# Patient Record
Sex: Male | Born: 1975 | Race: Black or African American | Hispanic: No | Smoking: Current every day smoker
Health system: Southern US, Community
[De-identification: ages and names within clinical notes are randomized; demographics above are authoritative.]

## PROBLEM LIST (undated history)

## (undated) DIAGNOSIS — M797 Fibromyalgia: Secondary | ICD-10-CM

## (undated) DIAGNOSIS — I10 Essential (primary) hypertension: Secondary | ICD-10-CM

## (undated) DIAGNOSIS — M329 Systemic lupus erythematosus, unspecified: Secondary | ICD-10-CM

## (undated) DIAGNOSIS — IMO0002 Reserved for concepts with insufficient information to code with codable children: Secondary | ICD-10-CM

## (undated) DIAGNOSIS — M1990 Unspecified osteoarthritis, unspecified site: Secondary | ICD-10-CM

## (undated) DIAGNOSIS — M5430 Sciatica, unspecified side: Secondary | ICD-10-CM

## (undated) HISTORY — PX: CHOLECYSTECTOMY: SHX55

## (undated) HISTORY — PX: HERNIA REPAIR: SHX51

---

## 2000-10-13 ENCOUNTER — Emergency Department (HOSPITAL_COMMUNITY): Admission: EM | Admit: 2000-10-13 | Discharge: 2000-10-13 | Payer: Self-pay | Admitting: *Deleted

## 2001-02-28 ENCOUNTER — Encounter: Payer: Self-pay | Admitting: *Deleted

## 2001-02-28 ENCOUNTER — Emergency Department (HOSPITAL_COMMUNITY): Admission: EM | Admit: 2001-02-28 | Discharge: 2001-02-28 | Payer: Self-pay | Admitting: *Deleted

## 2001-06-01 ENCOUNTER — Emergency Department (HOSPITAL_COMMUNITY): Admission: EM | Admit: 2001-06-01 | Discharge: 2001-06-02 | Payer: Self-pay | Admitting: Emergency Medicine

## 2001-06-02 ENCOUNTER — Encounter: Payer: Self-pay | Admitting: Emergency Medicine

## 2001-08-25 ENCOUNTER — Emergency Department (HOSPITAL_COMMUNITY): Admission: EM | Admit: 2001-08-25 | Discharge: 2001-08-25 | Payer: Self-pay | Admitting: Emergency Medicine

## 2002-02-11 ENCOUNTER — Encounter: Payer: Self-pay | Admitting: Emergency Medicine

## 2002-02-11 ENCOUNTER — Emergency Department (HOSPITAL_COMMUNITY): Admission: EM | Admit: 2002-02-11 | Discharge: 2002-02-11 | Payer: Self-pay | Admitting: Internal Medicine

## 2002-04-17 ENCOUNTER — Emergency Department (HOSPITAL_COMMUNITY): Admission: EM | Admit: 2002-04-17 | Discharge: 2002-04-17 | Payer: Self-pay | Admitting: Emergency Medicine

## 2002-10-16 ENCOUNTER — Emergency Department (HOSPITAL_COMMUNITY): Admission: EM | Admit: 2002-10-16 | Discharge: 2002-10-17 | Payer: Self-pay | Admitting: Emergency Medicine

## 2002-10-17 ENCOUNTER — Encounter: Payer: Self-pay | Admitting: Emergency Medicine

## 2003-02-10 ENCOUNTER — Encounter: Payer: Self-pay | Admitting: *Deleted

## 2003-02-10 ENCOUNTER — Emergency Department (HOSPITAL_COMMUNITY): Admission: EM | Admit: 2003-02-10 | Discharge: 2003-02-11 | Payer: Self-pay | Admitting: Emergency Medicine

## 2003-04-25 ENCOUNTER — Emergency Department (HOSPITAL_COMMUNITY): Admission: EM | Admit: 2003-04-25 | Discharge: 2003-04-25 | Payer: Self-pay | Admitting: Emergency Medicine

## 2003-12-31 ENCOUNTER — Emergency Department (HOSPITAL_COMMUNITY): Admission: EM | Admit: 2003-12-31 | Discharge: 2003-12-31 | Payer: Self-pay | Admitting: *Deleted

## 2004-01-06 ENCOUNTER — Emergency Department (HOSPITAL_COMMUNITY): Admission: EM | Admit: 2004-01-06 | Discharge: 2004-01-07 | Payer: Self-pay | Admitting: Emergency Medicine

## 2004-02-02 ENCOUNTER — Emergency Department (HOSPITAL_COMMUNITY): Admission: EM | Admit: 2004-02-02 | Discharge: 2004-02-02 | Payer: Self-pay | Admitting: Emergency Medicine

## 2004-03-27 ENCOUNTER — Emergency Department (HOSPITAL_COMMUNITY): Admission: EM | Admit: 2004-03-27 | Discharge: 2004-03-28 | Payer: Self-pay | Admitting: *Deleted

## 2004-04-02 ENCOUNTER — Emergency Department (HOSPITAL_COMMUNITY): Admission: EM | Admit: 2004-04-02 | Discharge: 2004-04-02 | Payer: Self-pay | Admitting: Emergency Medicine

## 2004-12-01 ENCOUNTER — Emergency Department (HOSPITAL_COMMUNITY): Admission: EM | Admit: 2004-12-01 | Discharge: 2004-12-02 | Payer: Self-pay | Admitting: Emergency Medicine

## 2005-01-21 ENCOUNTER — Inpatient Hospital Stay (HOSPITAL_COMMUNITY): Admission: EM | Admit: 2005-01-21 | Discharge: 2005-01-26 | Payer: Self-pay | Admitting: Emergency Medicine

## 2005-01-23 ENCOUNTER — Encounter (INDEPENDENT_AMBULATORY_CARE_PROVIDER_SITE_OTHER): Payer: Self-pay | Admitting: General Surgery

## 2005-01-24 ENCOUNTER — Ambulatory Visit: Payer: Self-pay | Admitting: *Deleted

## 2005-02-12 ENCOUNTER — Observation Stay (HOSPITAL_COMMUNITY): Admission: EM | Admit: 2005-02-12 | Discharge: 2005-02-13 | Payer: Self-pay | Admitting: Emergency Medicine

## 2005-03-21 ENCOUNTER — Inpatient Hospital Stay (HOSPITAL_COMMUNITY): Admission: EM | Admit: 2005-03-21 | Discharge: 2005-03-25 | Payer: Self-pay | Admitting: Emergency Medicine

## 2005-03-24 ENCOUNTER — Encounter: Payer: Self-pay | Admitting: Internal Medicine

## 2005-04-05 ENCOUNTER — Emergency Department (HOSPITAL_COMMUNITY): Admission: EM | Admit: 2005-04-05 | Discharge: 2005-04-05 | Payer: Self-pay | Admitting: Emergency Medicine

## 2005-07-07 ENCOUNTER — Emergency Department (HOSPITAL_COMMUNITY): Admission: EM | Admit: 2005-07-07 | Discharge: 2005-07-07 | Payer: Self-pay | Admitting: Emergency Medicine

## 2006-08-27 ENCOUNTER — Emergency Department (HOSPITAL_COMMUNITY): Admission: EM | Admit: 2006-08-27 | Discharge: 2006-08-27 | Payer: Self-pay | Admitting: Emergency Medicine

## 2007-06-16 ENCOUNTER — Emergency Department (HOSPITAL_COMMUNITY): Admission: EM | Admit: 2007-06-16 | Discharge: 2007-06-16 | Payer: Self-pay | Admitting: Emergency Medicine

## 2008-01-06 ENCOUNTER — Emergency Department (HOSPITAL_COMMUNITY): Admission: EM | Admit: 2008-01-06 | Discharge: 2008-01-06 | Payer: Self-pay | Admitting: Emergency Medicine

## 2008-02-12 ENCOUNTER — Ambulatory Visit (HOSPITAL_COMMUNITY): Admission: RE | Admit: 2008-02-12 | Discharge: 2008-02-12 | Payer: Self-pay | Admitting: Pediatrics

## 2008-02-27 ENCOUNTER — Emergency Department (HOSPITAL_COMMUNITY): Admission: EM | Admit: 2008-02-27 | Discharge: 2008-02-27 | Payer: Self-pay | Admitting: Emergency Medicine

## 2008-03-24 ENCOUNTER — Emergency Department (HOSPITAL_COMMUNITY): Admission: EM | Admit: 2008-03-24 | Discharge: 2008-03-24 | Payer: Self-pay | Admitting: Emergency Medicine

## 2008-03-28 ENCOUNTER — Emergency Department (HOSPITAL_COMMUNITY): Admission: EM | Admit: 2008-03-28 | Discharge: 2008-03-29 | Payer: Self-pay | Admitting: Emergency Medicine

## 2008-03-28 ENCOUNTER — Ambulatory Visit: Payer: Self-pay | Admitting: Orthopedic Surgery

## 2008-03-28 DIAGNOSIS — S62339A Displaced fracture of neck of unspecified metacarpal bone, initial encounter for closed fracture: Secondary | ICD-10-CM | POA: Insufficient documentation

## 2008-03-31 ENCOUNTER — Encounter: Payer: Self-pay | Admitting: Orthopedic Surgery

## 2008-04-04 ENCOUNTER — Ambulatory Visit: Payer: Self-pay | Admitting: Orthopedic Surgery

## 2008-04-08 ENCOUNTER — Ambulatory Visit (HOSPITAL_COMMUNITY): Admission: RE | Admit: 2008-04-08 | Discharge: 2008-04-08 | Payer: Self-pay | Admitting: Orthopedic Surgery

## 2008-04-08 ENCOUNTER — Ambulatory Visit: Payer: Self-pay | Admitting: Orthopedic Surgery

## 2008-04-13 ENCOUNTER — Ambulatory Visit: Payer: Self-pay | Admitting: Orthopedic Surgery

## 2008-04-18 ENCOUNTER — Encounter: Payer: Self-pay | Admitting: Orthopedic Surgery

## 2008-04-18 ENCOUNTER — Telehealth: Payer: Self-pay | Admitting: Orthopedic Surgery

## 2008-04-20 ENCOUNTER — Ambulatory Visit: Payer: Self-pay | Admitting: Orthopedic Surgery

## 2008-05-09 ENCOUNTER — Ambulatory Visit: Payer: Self-pay | Admitting: Orthopedic Surgery

## 2008-05-30 ENCOUNTER — Ambulatory Visit (HOSPITAL_COMMUNITY): Admission: RE | Admit: 2008-05-30 | Discharge: 2008-05-30 | Payer: Self-pay | Admitting: Pediatrics

## 2008-06-21 ENCOUNTER — Emergency Department (HOSPITAL_COMMUNITY): Admission: EM | Admit: 2008-06-21 | Discharge: 2008-06-21 | Payer: Self-pay | Admitting: Emergency Medicine

## 2008-06-22 ENCOUNTER — Ambulatory Visit: Payer: Self-pay | Admitting: Orthopedic Surgery

## 2008-06-27 ENCOUNTER — Encounter: Payer: Self-pay | Admitting: Orthopedic Surgery

## 2008-06-27 ENCOUNTER — Encounter (HOSPITAL_COMMUNITY): Admission: RE | Admit: 2008-06-27 | Discharge: 2008-07-27 | Payer: Self-pay | Admitting: Orthopedic Surgery

## 2008-07-14 ENCOUNTER — Emergency Department (HOSPITAL_COMMUNITY): Admission: EM | Admit: 2008-07-14 | Discharge: 2008-07-15 | Payer: Self-pay | Admitting: Emergency Medicine

## 2008-08-25 ENCOUNTER — Encounter: Payer: Self-pay | Admitting: Orthopedic Surgery

## 2010-10-01 LAB — BASIC METABOLIC PANEL
Chloride: 102 mEq/L (ref 96–112)
Creatinine, Ser: 1 mg/dL (ref 0.4–1.5)
Potassium: 3.2 mEq/L — ABNORMAL LOW (ref 3.5–5.1)
Sodium: 134 mEq/L — ABNORMAL LOW (ref 135–145)

## 2010-10-01 LAB — DIFFERENTIAL
Lymphocytes Relative: 22 % (ref 12–46)
Lymphs Abs: 1.8 10*3/uL (ref 0.7–4.0)
Monocytes Relative: 9 % (ref 3–12)
Neutrophils Relative %: 68 % (ref 43–77)

## 2010-10-01 LAB — POCT CARDIAC MARKERS
CKMB, poc: <1 ng/mL — ABNORMAL LOW (ref 1.0–8.0)
Troponin i, poc: <0.05 ng/mL (ref 0.00–0.09)

## 2010-10-01 LAB — RAPID URINE DRUG SCREEN, HOSP PERFORMED
Amphetamines: NOT DETECTED
Barbiturates: NOT DETECTED
Benzodiazepines: NOT DETECTED
Opiates: NOT DETECTED
Tetrahydrocannabinol: NOT DETECTED

## 2010-10-01 LAB — D-DIMER, QUANTITATIVE: D-Dimer, Quant: 0.22 ug/mL-FEU (ref 0.00–0.48)

## 2010-10-01 LAB — CBC
Hemoglobin: 15.7 g/dL (ref 13.0–17.0)
RBC: 5.1 MIL/uL (ref 4.22–5.81)
WBC: 8.4 10*3/uL (ref 4.0–10.5)

## 2010-10-30 NOTE — Op Note (Signed)
NAME:  Cory Watson, Cory Watson            ACCOUNT NO.:  [id number]   MEDICAL RECORD NO.:  [id number]          PATIENT TYPE:  AMB   LOCATION:  DAY                           FACILITY:  [facility]   PHYSICIAN:  Vickki Hearing, M.D.DATE OF BIRTH:  December 16, 1975   DATE OF PROCEDURE:  04/08/2008  DATE OF DISCHARGE:                               OPERATIVE REPORT   PREOPERATIVE DIAGNOSIS:  Closed right fifth metacarpal fracture.   POSTOPERATIVE DIAGNOSIS:  Closed right fifth metacarpal fracture.   PROCEDURE:  Open treatment and internal fixation of right fifth  metacarpal.   IMPLANTS USED:  Three 0.045 K-wires.   HISTORY:  This is a 32-year right-hand dominant male who was injured on  March 21, 2008, when he punched a wall.  He was advised on having  surgery, he declined.  He then came in a week later morning surgery  after closed treatment had been initiated, so we went ahead and  scheduled him for surgery.   SURGEON:  Vickki Hearing, MD   ASSISTANTS:  None.   ANESTHETIC:  General.   OPERATIVE FINDINGS:  I could not reduce the fracture closed and I  attempted it twice, and I used C-arm to confirm that the fracture was  not moving.   There was minimal blood loss.  Counts were correct.  The patient was in  stable condition when he left the OR.  Tourniquet was used, 250 mmHg.   DETAILS OF PROCEDURE:  The patient was identified as Cory Watson.  Right hand was marked, surgery countersigned by the surgeon.  The  antibiotics were instituted.  He was taken to surgery, given general  anesthesia.  His right hand was prepped with DuraPrep, draped sterilely,  and closed manipulation was performed.  After time-out, it was  unsuccessful.  We then elevated the tourniquet after exsanguination of  the limb.  We made an incision between the fourth and fifth metacarpals  over the fracture site, deepened it through subcutaneous tissue.  Blunt  dissection was carried out into the periosteum.  The  periosteum was  dissected sharply away from the fracture and the fracture was reduced,  closed.  K-wire was then fired percutaneously between the fifth and  fourth metacarpal heads.  Radiograph confirmed reduction and a total of  3 K-wires were placed in the same manner.  Fracture was stable.  X-rays  were good.  The wound was irrigated and closed with 2-0 Monocryl and 3-0  nylon.  The patient was placed in a baseball-type splint in the safe  position.  We did inject 10 mL of plain Sensorcaine in the wound.   The patient will be discharged on Percocet 5/325, #30, which he will  take for 5 days.  I will see him on April 13, 2008, at 8:45 in the  morning.      Vickki Hearing, M.D.  Electronically Signed     SEH/MEDQ  D:  04/08/2008  T:  04/08/2008  Job:  [id number]

## 2010-11-02 NOTE — Procedures (Signed)
NAME:  Cory Watson, Cory Watson            ACCOUNT NO.:  [id number]   MEDICAL RECORD NO.:  [id number]          PATIENT TYPE:  EMS   LOCATION:  ED                            FACILITY:  [facility]   PHYSICIAN:  Edward L. Juanetta Gosling, M.D.DATE OF BIRTH:  [birth year]   DATE OF PROCEDURE:  03/27/2004  DATE OF DISCHARGE:  03/28/2004                                EKG INTERPRETATION   TIME:  2255   The rhythm is sinus tachycardia with a rate of about 110.  There is left  atrial enlargement.  Left ventricular hypertrophy is seen.  Q waves are seen  inferiorly which are actually fairly small, and there is mild ST elevation  which may be related to an inferior infarction, but it does not have a  typical appearance of an acute inferior infarction.  Clinical correlation is  suggested.  Abnormal electrocardiogram.     Edwa   ELH/MEDQ  D:  03/28/2004  T:  03/28/2004  Job:  [id number]

## 2010-11-02 NOTE — Op Note (Signed)
NAME:  Cory Watson, Cory Watson NO.:  [id number]   MEDICAL RECORD NO.:  [id number]          PATIENT TYPE:  INP   LOCATION:  A224                          FACILITY:  [facility]   PHYSICIAN:  Barbaraann Barthel, M.D. DATE OF BIRTH:  23-May-1976   DATE OF PROCEDURE:  01/23/2005  DATE OF DISCHARGE:                                 OPERATIVE REPORT   PREOPERATIVE DIAGNOSIS:  Acute cholecystitis secondary to cholelithiasis   POSTOPERATIVE DIAGNOSIS:  Acute cholecystitis secondary to cholelithiasis.   PROCEDURE:  Attempted laparoscopic cholecystectomy, conversion to open  cholecystectomy.   SPECIMEN:  Gallbladder with stones.   GROSS OPERATIVE FINDINGS:  The patient had an acutely inflamed and edematous  gallbladder with large stones within it. The patient had a great deal of  q.d. inflammation and likely some areas of gangrene within it as well.   The rest of the right upper quadrant appeared to be normal.   TECHNIQUE:  The patient was placed in supine position, and after the  adequate administration of general anesthesia via endotracheal intubation,  his entire was prepped with Betadine solution and draped in the usual  manner. The Foley catheter was aseptically inserted. We then made a  periumbilical incision over the superior aspect of the umbilicus and grasped  the fascia with a sharp towel clip and elevated it. We then insufflated the  Veress needle after confirming its position with a saline drop test. It was  insufflated with approximately 2.5 liters of CO2. An 11-mm cannula was  placed. Then under the direct vision, three other cannulas, 11-mm in the  epigastrium and two 5-mm cannulas, in the right upper quadrant were placed.  The gallbladder was then decompressed the using the Weck needle with some 30  or 40 cc of dark green crank case like bile. We were able to grab the  gallbladder then. The edema and inflammation was so great down the distal  portion of the gallbladder  that I did not feel comfortable doing this as a  laparoscopic procedure and then quickly converted to an open procedure. We  then left the abdomen insufflated. We then made safe incision in the right  subcostal area through skin, subcutaneous tissue, dividing the rectus muscle  with a cautery device and entering the peritoneal cavity and exploring with  the above findings. The gallbladder was then decompressed again a bit more.  We the dissected carefully down from the fundus of the gallbladder down  towards cystic duct, identifying this towards the end of the dissection and  ligating this with 2-0 silk sutures and clipping the cystic duct. After  checking for hemostasis after cauterizing the liver bed, I elected to leave  the piece of Surgicel in the liver bed and a Jackson-Pratt drain as well in  the liver bed. A Jackson-Pratt drain was placed through one of the 5-mm  cannula incisions. After irrigating and checking for hemostasis, the  peritoneal cavity was closed with a 0 Polysorb suture in a running fashion,  and then the fascia was closed with figure-of-eight 0 Polysorb sutures  interruptedly. Subcu was irrigated, and the skin was approximated  with a  stapling device. Prior to closure, all sponge, needle and instrument counts  were found to be correct. A Jackson-Pratt drain was sutured in  place with 3-0 nylon, and likewise, we used the surgical staples to close  the umbilicus and the smaller cannula sites incision. There were no  complications. The patient lost perhaps 150 cc of blood and received 2,400  cc of crystalloids intraoperatively. The patient was taken to recovery room  in satisfactory condition.      Barbaraann Barthel, M.D.  Electronically Signed     WB/MEDQ  D:  01/23/2005  T:  01/23/2005  Job:  [id number]   cc:   Rito Ehrlich, M.D.  Hospitalist service

## 2010-11-02 NOTE — Discharge Summary (Signed)
NAME:  Cory Watson, Cory Watson            ACCOUNT NO.:  [id number]   MEDICAL RECORD NO.:  [id number]          PATIENT TYPE:  INP   LOCATION:  A340                          FACILITY:  [facility]   PHYSICIAN:  Osvaldo Shipper, MD     DATE OF BIRTH:  1975-06-22   DATE OF ADMISSION:  03/20/2005  DATE OF DISCHARGE:  LH                                 DISCHARGE SUMMARY   TRANSFER SUMMARY:  The patient being transferred to [facility].   DISCHARGE DIAGNOSES:  1.  Bilateral calf pain with elevated CKs, rule out myositis, fasciitis,      other autoimmune etiology.  2.  Hypertension.  3.  Previous history of cocaine use.   Please review H&P dictated at the time of admission for details regarding  patient's presenting illness.   BRIEF HOSPITAL COURSE:  Briefly, this is a 35 year old African-American male  with a past medical history significant for hypertension, thought likely  secondary to cocaine use, history of recent cholecystectomy for  cholecystitis, past history of chest pain of noncardiac etiology.  The  patient has also been told that he had lupus, this is many years ago;  however, the patient mentioned that he has never followed up for this.   The patient presented to the ED here at [facility] with complaints  of bilateral calf pain.  This was on March 20, 2005.  The patient gave a  history of having walked about 15 miles or so one day prior to his onset of  symptoms.  The patient also has a history of cocaine use, although he has  denied this to me at this time.  A urine drug screen was negative for  cocaine during this admission.  Initially, his CKs were elevated to up to  4000, and it was thought that the patient had mild rhabdomyolysis either  secondary to his unusual strain in combination with cocaine use.  The  patient was started on intravenous fluids as a management for his condition.  However, the patient's CKs trended upwards slowly.  His symptoms have  actually worsened since he has been in the hospital.  He also is having  difficulty bearing weight because of the calf pain.  On examination, the  patient does have bilateral calf tenderness;  however, there is no erythema,  any warmth, any rash.  The calves do seem slightly tense to palpation, but  not appreciably so.  Peripheral pulses are palpable bilaterally.  His AST is  also mildly elevated at this time.  Since the patient's symptoms are  actually getting worse and his CKs are actually trending upwards,  rhabdomyolysis as a cause for his symptoms is probably still in the  differential, but it is probably lower down.  I am very concerned that the  patient might have some kind of myositis versus some kind of autoimmune  process or some fasciitis going on which could be causing these problems.  The patient might need to have an EMG and/or a muscle biopsy done to further  evaluate his condition.  Considering all of the above, I have  discussed this  patient's case with Dr. Sharon Seller who is a hospitalist with [facility]  at [facility], and he has graciously accepted this patient  for further evaluation.  We will be attempting to send specialized  laboratory studies from here which includes aldolase, LDH, ANA, dsDNA, anti-  GO1, and anti-RNP.  The patient might benefit from being seen by a  rheumatologist as well.   The patient was taking Questran earlier, but Questran is not known to cause  rhabdomyolysis.  The patient is also not on any statin at this time.   I have discussed all of the above with the patient, and he is agreeable to  be transferred to [facility] for further care.   TRANSFER MEDICATIONS:  1.  Lisinopril 20 mg p.o. daily.  2.  Normal saline with 20 of potassium chloride at 200 cc/h.  3.  Tylenol 650 mg p.o. q.4 h. p.r.n.  4.  Morphine 1-2 mg IV q.4 h. p.r.n.   DIET:  The patient has been on a low-salt diet.   PHYSICAL ACTIVITY:  No  restrictions are in place;  however, the patient is  finding it difficult to ambulate because of his significant pain.   OTHER TESTS DONE:  TSH and free T4 which were all within normal limits.  ESR  was tested which was 2.  D-dimer was done which was 0.22.   No imaging studies have been done.  MRI would probably be the best imaging  modality;  however, this study is not done in this hospital over the  weekends.      Osvaldo Shipper, MD  Electronically Signed     GK/MEDQ  D:  03/23/2005  T:  03/23/2005  Job:  [id number]   cc:   Lonia Blood, M.D.  Fax: [phone]

## 2010-11-02 NOTE — Discharge Summary (Signed)
NAME:  Cory Watson NO.:  [id number]   MEDICAL RECORD NO.:  [id number]          PATIENT TYPE:  INP   LOCATION:  3040                         FACILITY:  [facility]   PHYSICIAN:  Hettie Holstein, D.O.    DATE OF BIRTH:  1976/02/18   DATE OF ADMISSION:  03/23/2005  DATE OF DISCHARGE:  03/25/2005                                 DISCHARGE SUMMARY   NOTE:  Date of initial admission was March 20, 2005 under the service of  Dr. Sherle Watson at [facility].  The date of discharge was March 25, 2005.   ADMISSION DIAGNOSIS:  Mild rhabdomyolysis of undermined etiology.   DISCHARGE DIAGNOSIS:  Mild myonecrosis of bilateral gastrocnemius, medial  heads, of indeterminate etiology.   The patient had clinical improvement and down trend of creatinine kinase  during the hospitalization with plans for rheumatology follow up of these  findings.  He has undergone extensive serologic studies.  I requested the  assistance of Dr. Kellie Simmering of rheumatology to follow him in the outpatient  setting.   HISTORY OF PRESENTING ILLNESS:  For full details please refer to the history  and physical as dictated by Dr. Sherle Watson on March 20, 2005.  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  He reports developing complaints in both calves.  He denies  any trauma at work.  He has no other injuries that he can report in the PA  setting.  He has sought substance abuse help in the outpatient. In fact,  during this hospitalization was when he was supposed to initiate this  program.  In any event his drug screens were unremarkable.  On admission he underwent  thorough screening evaluations at [facility] and subsequently was  transferred to [facility] for more definitive workup and serologic  testing.   HOSPITAL COURSE:  The patient was accepted under the service of Dr. Darnelle Catalan.  His initial CKs were elevated as well as mild elevation  of his AST.  His  pain persisted with some resolution with IV fluids.  His peak CK total was  7,533 and decreased to 3,740 on the day of discharge.  He underwent magnetic  resonance imaging of his calf muscles at the sites of pain and it was  discovered he had some bilateral findings consistent with mid myonecrosis at  the medial heads of his gastrocnemius.  These were nonspecific.  I discussed  this with the radiologist and clinically it was not suspected that he was  suffering from compartment syndrome, specifically bilaterally.  His markers  were trending downwards and he was clinically improving.  I contacted Dr.  Kellie Simmering for further input in this regard and instructed Cory Watson to  refrain from strenuous activity until he sees Dr. Kellie Simmering and there is  complete resolution of his complaints.   The patient's laboratory studies that are available include a negative  hepatic C antibody, HIV nonreactive, aldolase was elevated at 76 and 89  respectively.  His total creatinine kinase decreased down to 3,740.  His  anti-Jo-1 antibodies were unremarkable.  ANA was negative.  DNA was also in  the negative range.  Other studies are pending and can be viewed by Dr.  Kellie Simmering in consultation.   DISPOSITION:  Cory Watson was discharged in clinically somewhat improved  condition.  We do not have any answers with regards to these findings and it  is suspected that this may be self-limited; and, in addition, it may respond  to steroids if he is still symptomatic upon reevaluation by Dr. Kellie Simmering.      Hettie Holstein, D.O.  Electronically Signed     ESS/MEDQ  D:  03/26/2005  T:  03/27/2005  Job:  [id number]   cc:   Aundra Dubin, M.D.  [address]  [place]  [place] [address]

## 2010-11-02 NOTE — Procedures (Signed)
NAME:  Cory Watson, Cory Watson            ACCOUNT NO.:  [id number]   MEDICAL RECORD NO.:  [id number]          PATIENT TYPE:  INP   LOCATION:  A339                          FACILITY:  [facility]   PHYSICIAN:  Vida Roller, M.D.   DATE OF BIRTH:  1975/07/28   DATE OF PROCEDURE:  01/24/2005  DATE OF DISCHARGE:                                  ECHOCARDIOGRAM   ECHOCARDIOGRAM NUMBER:  LV [id number], tape count [id number].   PRIMARY CARE PHYSICIAN:  Scarlett Presto, M.D.   HISTORY:  This is an evaluation for LV systolic function in a patient with  chest pain after a cholecystectomy.   FINDINGS:  Technical quality study is adequate.   M-MODE TRACINGS:  1.  The aorta is 33 mm.  2.  Left atrium is 39 mm.  3.  The septum is 13 mm.  4.  The posterior wall 13 mm.  5.  Left ventricular systolic dimension 47 mm.  6.  Left ventricular diastolic dimension is 47 mm.  7.  Left ventricular systolic dimension is 33 mm.   2-D AND DOPPLER IMAGING:  The left ventricle is normal size with mild  concentric left ventricular hypertrophy.  There are no wall motion  abnormalities.  The overall ejection fraction is normal at 60-65%.   The right ventricle is mildly dilated.  The RV systolic function appears to  be normal. There is no free wall hypertrophy.   The right atrium also appears to be slightly dilated.  The left atrium  appears to be normal size.  There is no evidence of color-flow across the  atrial septum.   The aortic valve is trileaflet, tricommissure with no stenosis or  regurgitation.   The mitral valve is morphologically unremarkable with no stenosis or  regurgitation.   The tricuspid valve is morphologically unremarkable with mild regurgitation.  The RV systolic pressure is elevated at about of 30-35 mmHg which is mild  hypertension.   The pulmonic valve is not well seen.   There is no pericardial effusion.   The inferior vena cava was not well seen.   The ascending aorta was and normal to limits  of the study.      Vida Roller, M.D.  Electronically Signed     JH/MEDQ  D:  01/24/2005  T:  01/25/2005  Job:  [id number]

## 2010-11-02 NOTE — Consult Note (Signed)
NAME:  Cory Watson, Cory Watson NO.:  [id number]   MEDICAL RECORD NO.:  [id number]          PATIENT TYPE:  INP   LOCATION:  A304                          FACILITY:  [facility]   PHYSICIAN:  Barbaraann Barthel, M.D. DATE OF BIRTH:  1976-05-15   DATE OF CONSULTATION:  02/12/2005  DATE OF DISCHARGE:                                   CONSULTATION   SURGERY CONSULTATION:   DATE OF CONSULTATION:  February 12, 2005   REASON FOR CONSULTATION:  Note surgery was asked to see this 35 year old  black male known to me in the recent past and on January 23, 2005 he underwent  an open cholecystectomy for a gangrenous gallbladder.   In essence, he came to the emergency room with complaints of abdominal pain,  nausea and vomiting, and diarrhea.  Surgery was asked to see him when a CT  revealed some small amount of air in his gallbladder fossa and I was asked  to see to rule out any postoperative gallbladder problems with him.   HISTORY OF PRESENT MEDICAL ILLNESS:  I saw the patient in my office last  week, his wound was well healed, he had no GI or GU problems, he had some  incisional discomfort, he had not been working but he was doing well.  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  He also stated that he had some diarrhea.  He was discharged on  Cipro postoperatively it should be noted.   Surgery was asked to evaluate him.   PHYSICAL EXAMINATION:  VITALS:  He is 35 years old, he is roughly 185  pounds, he is 5 feet 11 inches.  His temperature is 98.6, pulse rate 64 per  minute, respirations 12 and blood pressure was 95/62.  HEAD:  Normocephalic.  EYES:  Extraocular movements are intact.  Pupils are round and react to  light and accommodation.  There is no conjunctival pallor or scleral  injection.  NOSE AND MOUTH:  Nose and oral mucosa are somewhat dry.  NECK:  Neck  is supple and cylindrical without jugular vein distention,  thyromegaly or tracheal deviation.  There are no bruits auscultated.  CHEST:  Lungs are fairly clear both anterior and posterior auscultation.  ABDOMEN:  Abdomen is soft.  The patient is tender is over his incision only.  Bowel sounds are normoactive.  EXTREMITIES:  Grossly within normal limits.  RECTAL:  Rectal examination was deferred.   REVIEW OF SYSTEMS:  The patient has had a past history of acute  cholecystitis with an open cholecystectomy performed on January 23, 2005.  He  also has a past history of cocaine abuse and chronic bronchitis with  continued tobacco use.  Also, when he was in the hospital, he was diagnosed  as having hypertension.  He was seen by the hospitalists at that time and he  was discharged on some medications.  He has not followed up with anyone with  those medications and those are the only  medications he has been taking at  present other than his Cipro postoperatively which he should be finished  with that regimen postoperatively.   LABORATORY DATA:  The patient has a white count of 8.3 with an H&H of 14.6  and 42.7, platelet counts are normal at 273,000, there is not a left shift,  the neutrophils are 70%.  Sodium is 128, potassium 3.5, chloride 95, carbon  dioxide 23, blood sugar is 100, BUN is 15 with a creatinine of 1.9, and  liver function studies are all within normal limits with a bilirubin 0.8.  Lipase is 24.  CT scan showed a small amount of air right in the liver fossa  and some postoperative changes, these may be Surgicel or some postoperative  changes.  Sonogram however revealed nothing that appeared to be any hematoma  of any consequence or abscess.   IMPRESSION:  Acute dehydration with hyponatremia.  This is secondary to sun  exposure.  I doubt any postoperative gallbladder problems.  In lieu of his  past history of cocaine abuse, I will check a toxic screen and since he was   hypotensive when he came in I will continue his hydration and observation  and have the Medical Doctor Rito Ehrlich of the hospitalists service to see him  and evaluate as well.  I will also check stools for any Clostridium  difficile toxins in case this may be related to his antibiotic therapy from  his acute cholecystitis.  We will admit him for observation and I will  consult the hospitalists to follow him with me.      Barbaraann Barthel, M.D.  Electronically Signed     WB/MEDQ  D:  02/12/2005  T:  02/12/2005  Job:  [id number]   cc:   Hilario Quarry, M.D.  Fax: [phone]   Dr. Rito Ehrlich, Hospitalist Service

## 2010-11-02 NOTE — Procedures (Signed)
NAME:  Cory Watson, Cory Watson            ACCOUNT NO.:  [id number]   MEDICAL RECORD NO.:  [id number]          PATIENT TYPE:  OBV   LOCATION:  A304                          FACILITY:  [facility]   PHYSICIAN:  Edward L. Juanetta Gosling, M.D.DATE OF BIRTH:  [birth year]   DATE OF PROCEDURE:  02/12/2005  DATE OF DISCHARGE:  02/13/2005                                EKG INTERPRETATION   PROCEDURE:  Electrocardiogram.   TIME AND DATE:  2304 on February 12, 2005   PHYSICIAN:  Edward L. Juanetta Gosling, M.D.   INTERPRETATION:  The rhythm is sinus rhythm with a rate in the 60s.  The  axis is somewhat rightward.  There is ST elevation in some of the inferior  leads, and there are Q-waves inferiorly which could indicate a previous  inferior infarction, but it does not appear to be acute. There are ST-T wave  abnormalities anteriorly and laterally which could indicate ischemia.  Abnormal electrocardiogram.      Oneal Deputy. Juanetta Gosling, M.D.  Electronically Signed     ELH/MEDQ  D:  02/14/2005  T:  02/15/2005  Job:  [id number]
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
## 2010-11-02 NOTE — Discharge Summary (Signed)
NAME:  Cory Watson, Cory Watson            ACCOUNT NO.:  [id number]   MEDICAL RECORD NO.:  [id number]          PATIENT TYPE:  INP   LOCATION:  A304                          FACILITY:  [facility]   PHYSICIAN:  Osvaldo Shipper, MD     DATE OF BIRTH:  [birth year]   DATE OF ADMISSION:  02/12/2005  DATE OF DISCHARGE:  08/30/2006LH                                 DISCHARGE SUMMARY   DISCHARGE DIAGNOSES:  1.  Dehydration, resolved.  2.  Gastroenteritis, resolving.  3.  Acute renal failure resolved.  4.  Status post cholecystectomy, surgically cleared by Dr. Malvin Johns.   Please review consultation dictated by Dr. Malvin Johns for details regarding  patient's presenting illness.   BRIEF HOSPITAL COURSE BY PROBLEMS:  Problem #1:  DEHYDRATION.  The patient  is a 35 year old African-American male who underwent open cholecystectomy  for acute cholecystitis about 2 weeks ago by Dr. Malvin Johns.  The patient  returned to work this past weekend, and yesterday he felt dizzy, had nausea,  vomiting with diarrhea, became dehydrated, and came into the ED.  In the ER  the patient was found to have elevated BUN and creatinine.  He was also  complaining of  abdominal pain for which he underwent a CAT scan of the  abdomen which suggested the possibility of either an abscess or hematoma in  the right upper quadrant.  The patient was subsequently seen by Dr. Malvin Johns  who felt that there was no acute process in that area.  The patient was  admitted, given IV fluids. His nausea and vomiting subsided.  He has been  able to tolerate p.o. intake pretty well.  His renal function has also come  back to normal.  The patient continues to have a moderate amount of  diarrhea.  A stool for C. difficile is pending at this time.   The patient has been asked to continue p.o. hydration for the next few days.  I have asked him to hold his lisinopril for the next 4-5 days.  We have  started the patient on Flagyl empirically and once the results  of C.  difficile is obtained we will convey further recommendations to the patient.  During his previous admission the patient had complained of chest pain.  He  underwent evaluation including serial cardiac enzymes and EKGs.  The patient  has history of cocaine abuse in the past.  He underwent echocardiogram on  the previous admission which showed normal EF with no wall motion  abnormalities.  He ruled out for acute coronary syndrome.  His chest pain  was thought to be musculoskeletal.   During this admission, again the patient mentioned left-sided chest pain.  He stated that the pain started when he started working yesterday.  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  The pain was clearly  musculoskeletal on clinical examination. His EKG has shown stable findings  of Q waves in II, III, and aVF and mild ST elevation in those leads.  The  patient's cardiac enzymes have been negative. He will be made an appointment  to see [facility] as an  outpatient for a possible stress test.   On previous admission the patient was scheduled to see Dr. Sudie Bailey after  discharge, however, the patient could not see him as he was admitted  yesterday.  We will reschedule that appointment for him.   DISCHARGE MEDICATIONS:  1.  Flagyl 500 mg p.o. t.i.d. for 14 days.  2.  Levaquin 500 mg p.o. daily for 2 days.  3.  Lisinopril 20 mg p.o. once daily to be started on September 4.  4.  Questran 4 gm with liquid before each meal and at bedtime.   On the day of discharge the patient's vital signs were stable.  His blood  work is back to his baseline.  He has minimal symptoms.  He has been cleared  by Dr. Malvin Johns, surgically, to be discharged.  I have conveyed to the  patient that I will call him regarding the results of his stool study which  is pending at this time.  He is to take his Flagyl until he hears from me.   ADDENDUM:  C-diff was negative. I called the number that the patient  provided and left  a message for him to discontinue flagyl. He may take imodium to control the  diarrhea.      Osvaldo Shipper, MD  Electronically Signed     GK/MEDQ  D:  02/13/2005  T:  02/13/2005  Job:  [id number]   cc:   Mila Homer. Sudie Bailey, M.D.  [address] [place], [place] [address]  Fax: [phone]   Barbaraann Barthel, M.D.  [address]  [place]  [place] [address]  Fax: [phone]

## 2010-11-02 NOTE — Consult Note (Signed)
NAME:  Cory Watson, Cory Watson            ACCOUNT NO.:  [id number]   MEDICAL RECORD NO.:  [id number]          PATIENT TYPE:  INP   LOCATION:  A323                          FACILITY:  [facility]   PHYSICIAN:  Osvaldo Shipper, MD     DATE OF BIRTH:  [birth year]   DATE OF CONSULTATION:  01/22/2005  DATE OF DISCHARGE:                                   CONSULTATION   REASON FOR CONSULTATION:  Chest pain.   REQUESTING PHYSICIAN:  Dr. Barbaraann Barthel   CHIEF COMPLAINT:  Chest pain.   HISTORY OF PRESENT ILLNESS:  The patient is a 35 year old African-American  male who has a past history of cocaine abuse but none since May of this year  as per the patient. He also gives a questionable history of lupus in  himself, diagnosed by a physician many years ago for which the patient has  not been making any follow up visit nor is he on any medications. He is very  unclear about this history. Yesterday morning the patient woke up with a  severe right upper quadrant abdominal pain. The patient came to the  emergency room and was diagnosed with acute cholecystitis and was admitted  to the hospital for surgical treatment. This morning while Dr. Malvin Johns was  seeing the patient, the patient complained of left-sided chest pain and  hence we were consulted to see the patient.   The patient gives a history of a sharp 8 out of 10 left-sided chest pain  over the left precordium. The pain started about 5 o'clock this morning, has  been pretty continuous but the intensity of the pain has been waxing and  waning. The pain does not seem to radiate to him arm, shoulder, neck or jaw.  It does not seem to radiate to his back either.   The patient experienced similar pain yesterday morning as well, but did not  pay attention to it. He also mentioned that he felt this pain about 1 week  ago while at work. 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 He does not give any history of any pulled muscle or any  other  injury to his chest wall. The pain seems to be persisting when the patient  lies on his back but seems to go away when he turns to his right. Sitting up  also does not seem to relieve his pain in any way. The patient has been  walking to the bathroom with which he is not experiencing increase in the  pain. The pain is described as sudden in onset this morning. Deep breathing  seems to increase the character and intensity of the pain.   The patient admits to having noted acid reflux and he gets a sour taste in  his mouth. He does not take any medications for it as well. The patient  mentioned that before this past week he has not experienced any such pain.  He does not have any heart condition that he knows of. The patient also  denies any trauma to his chest wall.   MEDICATIONS:  Currently the patient  is on no medications at home.   ALLERGIES:  The patient does not report any drug allergies.   PAST MEDICAL HISTORY:  Except for this questionable history of lupus, he  denies any other medical problem. He denies any lung disease or any heart  conditions.   PAST SURGICAL HISTORY:  Left inguinal hernia repair and skin grafting.   SOCIAL HISTORY:  The patient lives in [place] with his girlfriend. He  smokes half-pack of cigarettes per day. He drinks alcohol mostly during  weekends but does not binge. He has a history of cocaine use and he said his  last use was May of 2006. He does not report any IV drug use. Does not  report any weight loss. He works as a Administrator.   FAMILY HISTORY:  There is a history of hypertension and diabetes in his  parents but neither have any heart disease that he knows of. His mother and  his sisters have lupus. None of them have had any clotting problems or have  been diagnosed with any heart condition that he knows of.   REVIEW OF SYSTEMS:  A 10 point review of systems was done which was negative  for any acute findings. The patient mentions  history of left knee pain  because of a sports injury in the past. He also complains of left elbow  pain. Does not give history of any arthralgias in his hands or any other  small joint. Denies any weight loss.   PHYSICAL EXAMINATION:  VITAL SIGNS: Temperature is recorded as 98.8, heart  rate is 70, respiratory rate is about 16, blood pressure 126/80, oxygen  saturation 98% on room air.  GENERAL:  This is a well-developed, well-nourished individual in slight  discomfort.  HEENT:  There is no pallor, no icterus. Oral mucosa is moist. No oral  lesions are seen.  NECK:  Soft and supple.  CARDIOVASCULAR:  S1, S2 is normal, regular, no murmurs appreciated. No JVD  seen, no bruits are heard.  LUNGS:  Clear to auscultation bilaterally, no rales or rhonchi appreciated.  CHEST WALL:  The pain is mildly reproducible to palpation.  ABDOMEN:  Soft, there is tenderness over the right upper quadrant with  Murphy's sign positive. Bowel sounds are very sluggish. Otherwise I do not  appreciate any organomegaly or mass.  EXTREMITIES:  Without edema. Peripheral pulses are palpable.  NEURO:  The patient is alert and oriented x3. No gross neurological deficits  appreciated.   LABORATORY DATA:  CBC shows white count of 9 thousand, hemoglobin of 14.6,  platelet count 192,000. Coag profile not available. Complete metabolic  profile shows electrolytes to be within normal limits. Creatinine also  within normal limits. Total bilirubin normal. AST is elevated at 163, ALT  133, alkaline phosphatase normal at 103, albumin is 3.3, amylase and lipase  within normal limits. A urinalysis was negative for infection.   No EKG available.   IMAGING STUDIES:  The patient had an ultrasound as well as a CAT scan of the  abdomen and pelvis which corroborated findings of acute cholecystitis. CT of  abdomen mentioned clear lower lung fields. No chest x-ray is available.  IMPRESSION:  This is a 35 year old African-American  male with a past history  suggestive of acid reflux disease and a questionable history of lupus as he  has been told by a physician many years ago. The patient presents with a  pleuritic chest pain, left-sided, which is somewhat reproducible to  palpation. The pain  does not appear to be cardiac in nature. The patient's  family history includes lupus and he has been told in the past that he might  have lupus. Based on this, the likelihood of patient being hypercoagulable  needs to be ruled out. We will also need to consider acid reflux as being  the cause of all this pain. Musculoskeletal obviously is also in the list of  differentials.   PLAN:  Chest pain:  I think the patient will benefit from being monitored on  a telemetry floor at least for the time being. We will give the patient  aspirin and give him nitroglycerin to see if there is relief of his pain. We  will get a cardiac panel tested q.8h x3. We will get an EKG done. D-dimer  and coagulation profile will be checked. The patient's pulse oximetry will  be monitored. A chest x-ray PA and lateral will be obtained to rule out  pneumonia or any other problems causing pleuritic chest pain. Will also  start the patient on Protonix to help control his acid reflux disease.   Further management decision will be based on results of further testing  described above.   We will continue to follow this patient closely and follow up on the results  of above tests. These findings and recommendations will be communicated to  Dr. Malvin Johns.   Thank you for allowing [place] to consult on this patient.       GK/MEDQ  D:  01/22/2005  T:  01/22/2005  Job:  [id number]   cc:   Barbaraann Barthel, M.D.  [address]  [place]  [place] [address]  Fax: [phone]

## 2010-11-02 NOTE — Consult Note (Signed)
NAME:  Cory Watson, Cory Watson NO.:  [id number]   MEDICAL RECORD NO.:  [id number]          PATIENT TYPE:  INP   LOCATION:  A304                          FACILITY:  [facility]   PHYSICIAN:  Osvaldo Shipper, MD     DATE OF BIRTH:  [birth year]   DATE OF CONSULTATION:  DATE OF DISCHARGE:                                   CONSULTATION   CONSULTING PHYSICIAN:  Osvaldo Shipper, M.D.   REQUESTING PHYSICIAN:  Barbaraann Barthel, M.D.   REASON FOR CONSULTATION:  Medical management.   HISTORY OF PRESENT ILLNESS:  The patient is a 35 year old African American  male who is well known to our service from a recent admission, under Dr.  Daisy Blossom service, for acute cholecystitis during which we managed his  medical problems.  During that time, the patient was known to be a cocaine  abuser and was complaining of chest pain.  The chest pain appeared  musculoskeletal at that time, however, his EKG showed apparent ST elevation  in the inferior leads with small Q waves.  This EKG was compared to a  previous EKG from more than a year ago which was similar to that.  The  patient subsequently had an echocardiogram which essentially showed normal  ejection fraction without any wall motion abnormalities.  The patient also  underwent serial cardiac enzymes which are negative.  The patient returned  to the ED with complaints of nausea, vomiting, and abdominal pain.  The  patient returned to work this past Saturday, did well on Saturday without  any difficulties.  The patient works as a Corporate treasurer to  cut weeds.  He was using such an instrument yesterday when he experienced  some mild left sided chest pain.  The chest pain he describes as a dull pain  present on the left side on his left arm which is constant continuous since  yesterday.  The patient did experience some shortness of breath along with  this episode.  However, the patient disregarded these symptoms, went home,  did well.   However, he did feel dizzy and nauseous.  This morning the  patient started throwing up, however, still went to work, felt really weak  and dizzy, and decided to come back to the emergency room for further  evaluation.   Currently the patient states that his pain is better.  He denies any nausea  at this time.  He did mention a history of diarrhea in the morning.  He  denies any blood in his stools.  The patient continues to complain of left  sided chest pain.  He denies cocaine use.   MEDICATIONS:  1.  The patient was discharged on Lisinopril 20 mg daily for his high blood      pressure.  2.  The patient was also discharged on nicotine patch, Darvocet,      ciprofloxacin.  He states he has been taking all of his medications regularly without any  interruptions.   ALLERGIES:  He is not allergic to any medications.   PAST MEDICAL HISTORY:  1.  Positive for a questionable history of  lupus, otherwise denied any other      medical problems.  2.  Recent diagnosis of hypertension which was made in the hospital.   He did have a partial workup for his chest pain during his previous hospital  stay.   PAST SURGICAL HISTORY:  1.  Left inguinal hernia repair.  2.  Skin graft in the past.  3.  Recent cholecystectomy done a couple of weeks ago.   SOCIAL HISTORY:  The patient lives in [place] with his girlfriend.  He  smokes half a pack of cigarettes per day.  He denies drinking any alcohol or  doing any cocaine recently.  As I mentioned, he works as a Administrator.   FAMILY HISTORY:  There is a history of hypertension, diabetes in his  parents, but neither have any heart disease.  His mother and sisters have  lupus.   REVIEW OF SYSTEMS:  Has been done which was unremarkable.   PHYSICAL EXAMINATION:  VITAL SIGNS:  Temperature is 98.9, heart rate 71,  respiratory rate is 16, blood pressure 111/63, saturating 100% on room air.  GENERAL:  This is a well built, well nourished individual in  no apparent  distress.  HEENT:  There is no pallor.  No icterus.  Oral mucous membranes are slightly  dry.  No oral lesions are seen.  NECK:  Soft and supple.  CARDIOVASCULAR:  S1 S2 is normal.  Regular.  No murmurs appreciated.  No  JVD.  No bruits heard.  LUNGS:  Clear to auscultation bilaterally.  CHEST WALL:  Tender to palpation over the site where he is complaining of  chest pain.  Clearly the pain is reproducible.  When the patient raises his  left arm also the pain character changes.  ABDOMEN:  Shows a scar in the right upper quadrant from his recent surgery.  Abdomen is tender to palpation in the right upper quadrant.  Bowel sounds  are present.  There is tenderness to palpation in the lower quadrants  bilaterally.  No masses appreciated by me.   LABORATORY DATA:  CBC showed a normal white count, hemoglobin, platelets all  within normal limits.  PT INR within normal limits.  D-dimmer was in the  normal range.  BMP showed a sodium 128, potassium 3.5, chloride 95, bicarb  23, glucose 100, BUN 15, creatinine 1.9.  LFTs within normal limits.  Total  protein elevated slightly at 8.7.  Albumin 4.4.  Calcium 9.  Lipase is  normal.  UA shows cloudy urine, more than 1.03 specific gravity, small  bilirubin, trace ketones, many bacteria, positive nitrite, leukocyte is  negative.  UA tox screen shows positive for cocaine and opiates.   IMAGING STUDIES:  The patient had a contrast study of the abdomen which  showed small density in the gallbladder fossa with several air bubbles.  The  radiologist commented that they can not rule out small abscess, infected  hematoma, etcetera.  Pelvis was negative.  A chest CT was also unremarkable,  once again this was a non-contrast study.  The patient subsequently had an  ultrasound of the gallbladder which shows a small density in the gallbladder fossa, no fluid collection, probably a small hematoma.   IMPRESSION:  1.  This is a 35 year old African  American male with a past medical history      of recent hypertension, recent acute cholecystitis status post open      cholecystectomy, who presents with a two-day history of nausea,  vomiting, abdominal pain, chest pain.  The patient is found to be      dehydrated.  2.  His chest pain is very atypical for cardiac etiology.  It is definitely      tender to palpation and appears to be musculoskeletal related to the use      of his machinery at work.  However, the patient did test positive for      cocaine in his urine.   PLAN:  1.  We will check the one set of cardiac enzymes considering his pain has      been present since yesterday.  2.  We will also check another EKG to make sure there is no evidence for      acute coronary syndrome.  3.  At this point since his blood pressure is running slightly on the low      side, and since the patient is dehydrated, and since he has elevated      creatinine, I will hold his blood pressure pill.  4.  The patient is already on IV fluids which is appropriate.  5.  His UA suggests infection for which I will start the patient on      levofloxacin.  6.  I will put the patient on a nicotine patch while he is in the hospital.  7.  I will defer all management of his abdominal pain to Dr. Malvin Johns at      this time.  Abdominal pain could be related to his recent surgery.  8.  Since he has been having diarrhea, we are going to rule out C. diff as      is being done.  All of his symptoms could also suggest acute      gastroenteritis.   Further management decisions will be based on the results of initial testing  and the patient's response to treatment.   I would like to thank Dr. Malvin Johns for allowing [place] to consult upon this  gentleman.      Osvaldo Shipper, MD  Electronically Signed     GK/MEDQ  D:  02/12/2005  T:  02/12/2005  Job:  [id number]   cc:   Barbaraann Barthel, M.D.  [address]  [place]  [place] [address]  Fax: [phone]   Mila Homer.  Sudie Bailey, M.D.  [address] [place], [place] [address]  Fax: [phone]

## 2010-11-02 NOTE — Procedures (Signed)
NAME:  Cory Watson, Cory Watson            ACCOUNT NO.:  [id number]   MEDICAL RECORD NO.:  [id number]          PATIENT TYPE:  EMS   LOCATION:  ED                            FACILITY:  [facility]   PHYSICIAN:  Edward L. Juanetta Gosling, M.D.DATE OF BIRTH:  March 20, 1976   DATE OF PROCEDURE:  03/27/2004  DATE OF DISCHARGE:  03/28/2004                                EKG INTERPRETATION   The rhythm is sinus tachycardia with a rate of about 120.  There is probable  left atrial enlargement.  Left ventricular hypertrophy by voltage criteria  is met.  There are Q waves inferiorly and some ST elevation inferiorly.  There is a great deal of baseline wander.  The computer has read this as  probable acute inferior infarction, and that is possible, but I think it is  less likely.  Abnormal electrocardiogram.     [place]   ELH/MEDQ  D:  03/28/2004  T:  03/28/2004  Job:  [id number]

## 2010-11-02 NOTE — H&P (Signed)
NAME:  AYDN, FERRARA            ACCOUNT NO.:  [id number]   MEDICAL RECORD NO.:  [id number]          PATIENT TYPE:  INP   LOCATION:  A340                          FACILITY:  [facility]   PHYSICIAN:  Margaretmary Dys, M.D.DATE OF BIRTH:  Jan 31, 1976   DATE OF ADMISSION:  03/20/2005  DATE OF DISCHARGE:  LH                                HISTORY & PHYSICAL   PRIMARY CARE PHYSICIAN:  None.   ADMISSION DIAGNOSES:  1.  Bilateral calf pain.  2.  Rhabdomyolysis probably secondary to trauma or cocaine use.  3.  History of poly-substance abuse.   CHIEF COMPLAINT:  Pain in both calves.   HISTORY OF PRESENT ILLNESS:  Mr. Cory Watson is a 35 year old African-  American male who presented to the emergency room with complaints of pain in  his calves. The patient reports that the pain started this morning. He does  report a history of trauma while at work. He said that some stuff he was  picking up dropped on his leg and he actually has a bruise over his left  pretibial area. He said that he took no note of it until this morning when  he noticed that he had trouble getting out of bed at about 3:00 a.m. to go  to the bathroom. He had severe pain in his calves. He denies any fever or  chills. No constitutional symptoms. No nausea, vomiting, diarrhea, or  abdominal pain. No weight loss. The patient reports to using crack cocaine 2  days ago. He also uses marijuana. He does not take any chronic medications.   REVIEW OF SYSTEMS:  Ten point review of systems is otherwise negative except  as mentioned in the history of present illness.   PAST MEDICAL HISTORY:  He has a history of hypertension.   PAST SURGICAL HISTORY:  The patient is status post cholecystectomy and left  inguinal hernia repair.   MEDICATIONS:  None.   FAMILY HISTORY:  Positive for hypertension, diabetes, and coronary artery  disease. Otherwise noncontributory.   SOCIAL HISTORY:  The patient is single. He works. He uses crack  cocaine and  smokes marijuana. He denies any intravenous drug abuse. He works as a  Administrator.   PHYSICAL EXAMINATION:  GENERAL:  Conscious, alert, comfortable and in no  acute distress. Well oriented to person, place, and time.  VITAL SIGNS:  Blood pressure 128/79, pulse of 80, respiratory rate 20,  temperature 97.6. Oxygen saturation 96% on room air.  HEENT:  Normocephalic and atraumatic. Oral mucosa was moist. No exudates.  NECK:  Supple. No jugular venous distention. No lymphadenopathy.  LUNGS:  Clear with good air entry bilaterally.  HEART:  S1 and S2 regular. No S3, S4, gallops, or rubs.  ABDOMEN:  Soft, nontender. Bowel sounds positive. No masses palpable.  EXTREMITIES:  He described the pain in his lower extremities as a 9 out of  10. The patient has bilateral calf tenderness with no erythema but warm to  touch. The patient also has a bruise on his anterior left pretibial area.   LABORATORY DATA:  White blood cell count 6.3. Hemoglobin and hematocrit 14.9  and 43.8. Platelet count 220,000. No left shift. D-dimer was negative at  0.22. Sodium 137, potassium 3.6, chloride 105, CO2 of 27, glucose 126, BUN  6, creatinine 1.0.  Creatine kinase was 4051.   ASSESSMENT/PLAN:  This is a 35 year old African-American male presenting  with bilateral calf tenderness. He also has an elevated CK. This is likely  consistent with rhabdomyolysis. The rhabdomyolysis may have been induced by  his cocaine use or his history of trauma, 2 days prior.   PLAN:  Admit him for observation at this time, overnight. We will hydrate  him aggressively with normal saline at 200 cc an hour and will recheck CK  levels in the morning. The patient, otherwise, will also be on pain control  medicines.   His kidney function is stable at this time.   CODE STATUS:  FULL CODE.   I explained the above plans to the patient. He verbalized full  understanding. We may not exactly have a finger on why he has elevated  CK  with bilateral calf tenderness but the possibility of cocaine induced  rhabdomyolysis is a possibility and may potentially get worse. Will monitor  him closely.      Margaretmary Dys, M.D.  Electronically Signed     AM/MEDQ  D:  03/20/2005  T:  03/20/2005  Job:  [id number]

## 2010-11-02 NOTE — Discharge Summary (Signed)
NAME:  Cory Watson, Cory Watson            ACCOUNT NO.:  [id number]   MEDICAL RECORD NO.:  [id number]          PATIENT TYPE:  INP   LOCATION:  A339                           FACILITY:   PHYSICIAN:  Barbaraann Barthel, M.D. DATE OF BIRTH:  15-Jul-1975   DATE OF ADMISSION:  01/21/2005  DATE OF DISCHARGE:  08/12/2006LH                                 DISCHARGE SUMMARY   DIAGNOSES:  1.  Acute cholecystitis secondary to cholelithiasis.  2.  History of cocaine abuse.  3.  Hypertension.  4.  Tobacco abuse.  5.  Chronic bronchitis.   CONSULTATIONS:  Dr. Rito Ehrlich of the hospitalist service for hypertension.   SURGERY:  Open cholecystectomy on January 23, 2005.   Note this is a 35 year old African-American male who has a past history of  cocaine abuse and recurrent right upper quadrant pain, nausea, and vomiting.  He came to the emergency room with these symptoms.  Sonogram revealed a very  thickened gallbladder with multiple stones. He had in essence changes of a  gangrenous gallbladder.  He was going to be taken to surgery and he  developed chest pain.  He was seen in consultation by Dr. Osvaldo Shipper who  ruled out a cardiac etiology for his chest pain.  He was then taken to  surgery on January 23, 2005 where open cholecystectomy was performed after a  laparoscopic cholecystectomy was abandoned for safety reasons.   Postoperatively, he did well.  He did develop some hypertension which I  believe he has had all along.  He has not really followed up with any  physician on a regular basis and this was treated by Dr. Rito Ehrlich again and  he was discharged on his third postoperative day at which time his Al Pimple drain is although approximately 70 cc for a 24-hours period was almost  entirely serous in nature  and is not unusual following that type of  surgery.  His wounds were clean.  He was tolerating p.o. well.  He was  moving his bowels, had some initial constipation, however, he was moving  his  bowels without any problem and had no dysuria.  He had no shortness of  breath and his breathing was controlled essentially for cessation of smoking  and his incentive spirometry.  No other tree was negative for that.  His  wounds were clean.  His Jackson-Pratt drain was removed on the third  postoperative day and he was discharged.   DISCHARGE INSTRUCTIONS:  Include to clean his wound with alcohol three times  a day.  He is told to do no swimming in pools or in the beach or in a  Jacuzzi, etc.  He is told to increase his activities as tolerated.  He is  permitted to shower, walk up and down the stairs, refrain from doing any  heavy lifting or driving long distances, and no vigorous sexual activity.  He is discharged on Darvocet N 100 1 tablet every 4 hours as needed for  pain, Cipro 500 milligrams two times a day for an additional 5 days,  lisinopril 20 milligrams daily, and nicotine patch 21 milligrams  topically  for 30 days.  His medical questions regarding his continued hypertension and  nicotine addiction will be followed up by Dr. Sudie Bailey.  These arrangements  were made by Dr. Rito Ehrlich.  He is instructed go to the emergency room for  any acute changes and I have made follow-up arrangements to see him in my  office on Tuesday, August 22, at 10:30 a.m..  He was told to go the  emergency room however if in the interim he has any problems.  He has also  been provided and note for work and to present to  the judge for his community service issues.   LABORATORY DATA:  As stated, sonogram showed changes of acute cholecystitis  with cholelithiasis.  His white count on January 24, 2005 was 10.5 with an  H&H of 12.9 and 37.3.  His neutrophils at that time were 75%.  His PT and  INR were within normal limits.  His electrolytes also were grossly within  normal limits and his liver function studies showed a downward trend after  surgery with a normal bilirubin.  His amylase was not  elevated.  His cardiac  workup please see the chart regarding Dr. Chancy Milroy evaluation of this and  in essence he had some  minor EKG changes but none of which were of cardiac  significance.  Chest x-rays showed showed some atelectasis were likely  relating to his diaphragmatic irritation from his acute cholecystitis.  As  stated, we have made follow-up arrangements to see this patient and he is  told to go the emergency room if there are any acute changes.      Barbaraann Barthel, M.D.  Electronically Signed     WB/MEDQ  D:  01/26/2005  T:  01/26/2005  Job:  [id number]   cc:   Osvaldo Shipper, MD   Mila Homer. Sudie Bailey, M.D.  [address] [place], [place] [address]  Fax: [phone]

## 2010-12-05 IMAGING — CR DG LUMBAR SPINE COMPLETE 4+V
5 series · 5 of 5 positions shown · non-contrast
Comparison: 02/12/2008

CLINICAL DATA: Low back pain.  Bilateral leg pain for 6 months.

LUMBAR SPINE - COMPLETE 4+ VIEW

[view not recorded (1 of 5)]
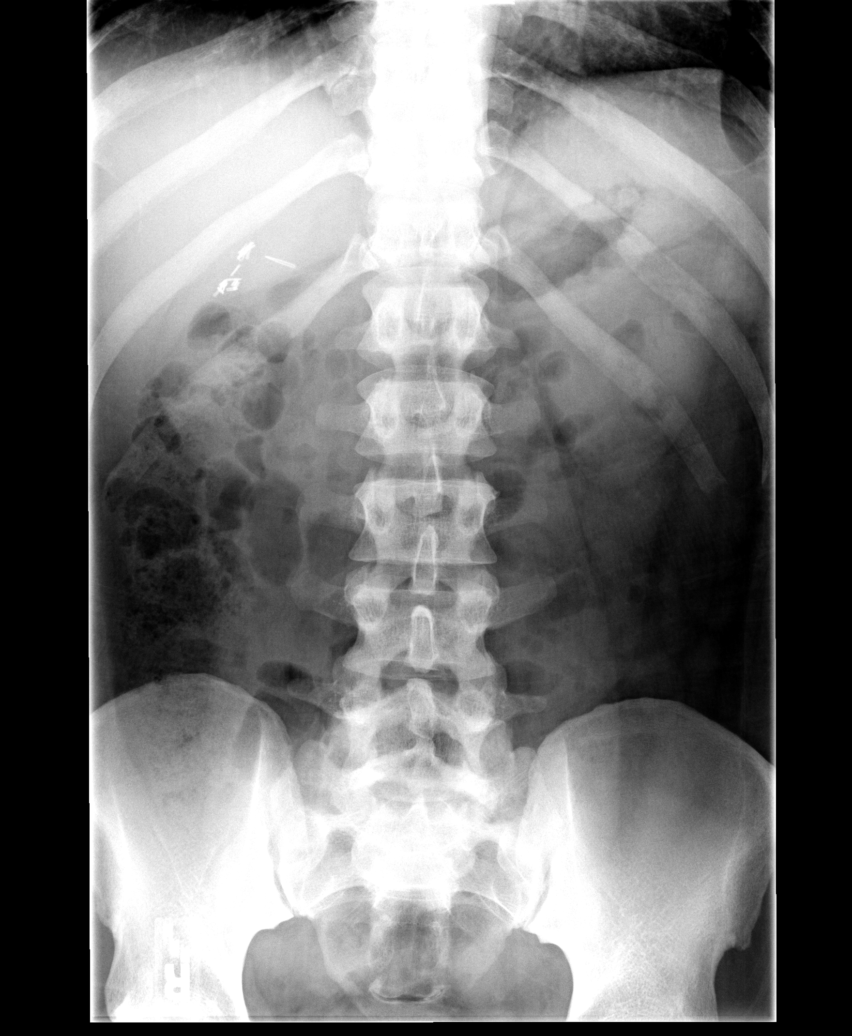

[view not recorded (2 of 5)]
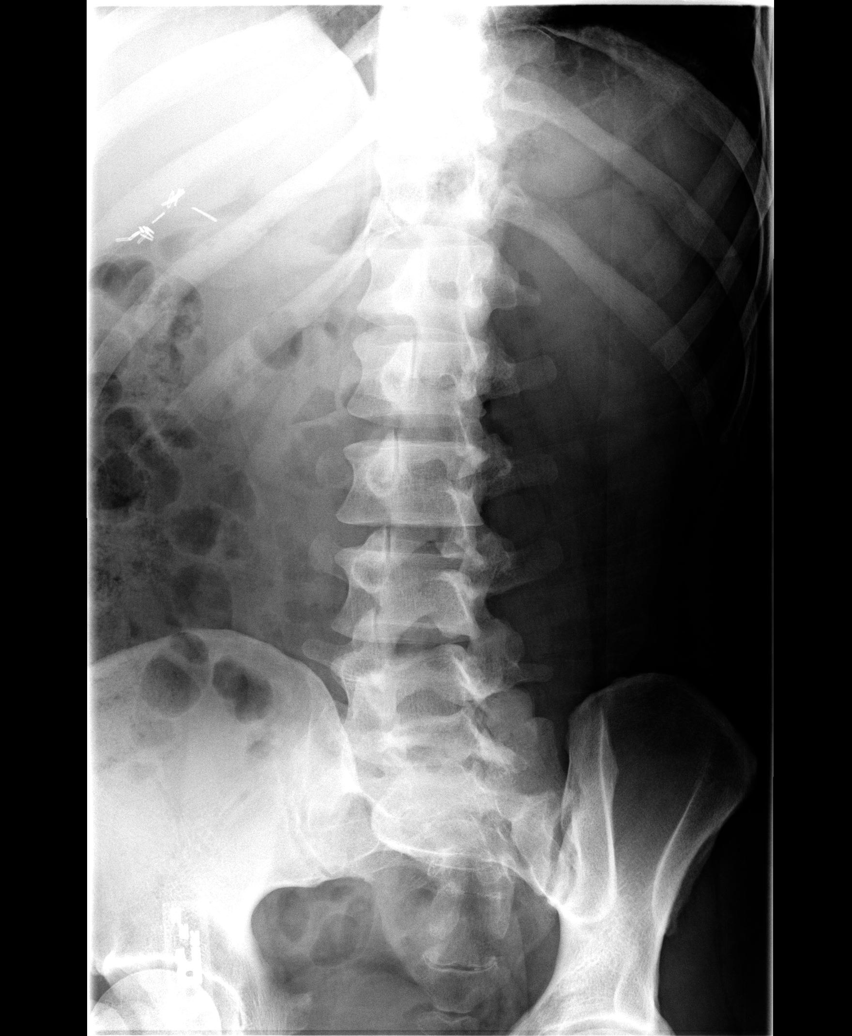

[view not recorded (3 of 5)]
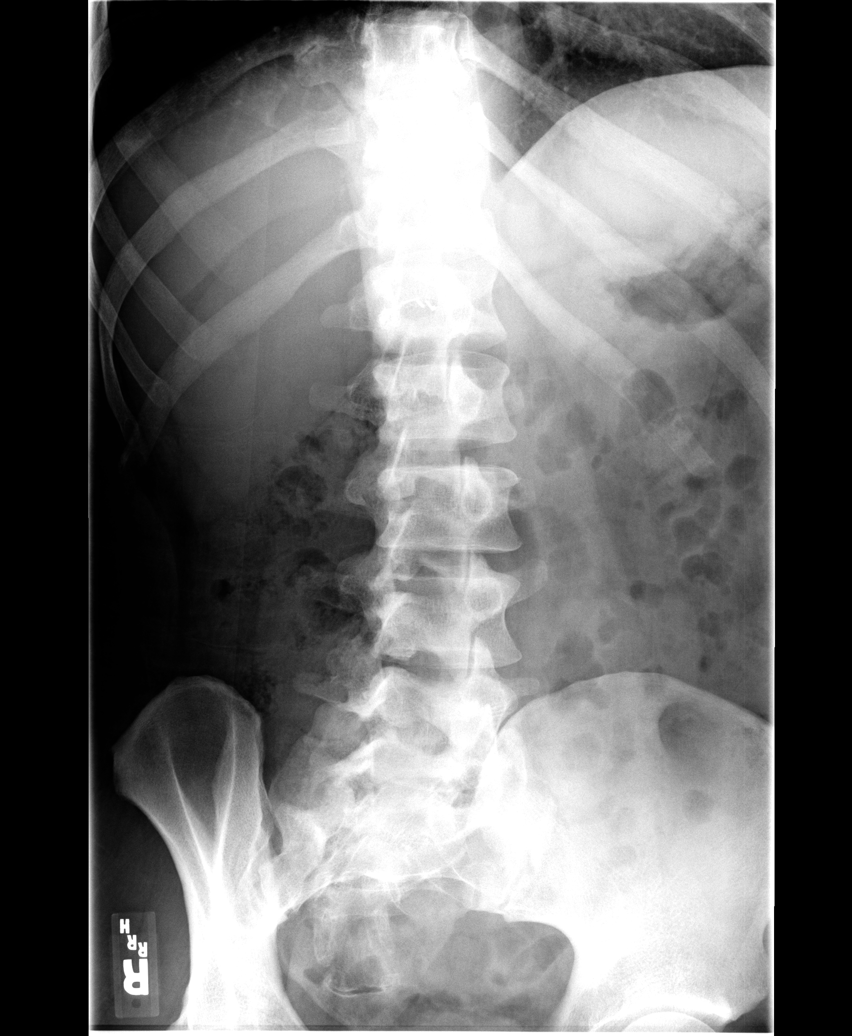

[view not recorded (4 of 5)]
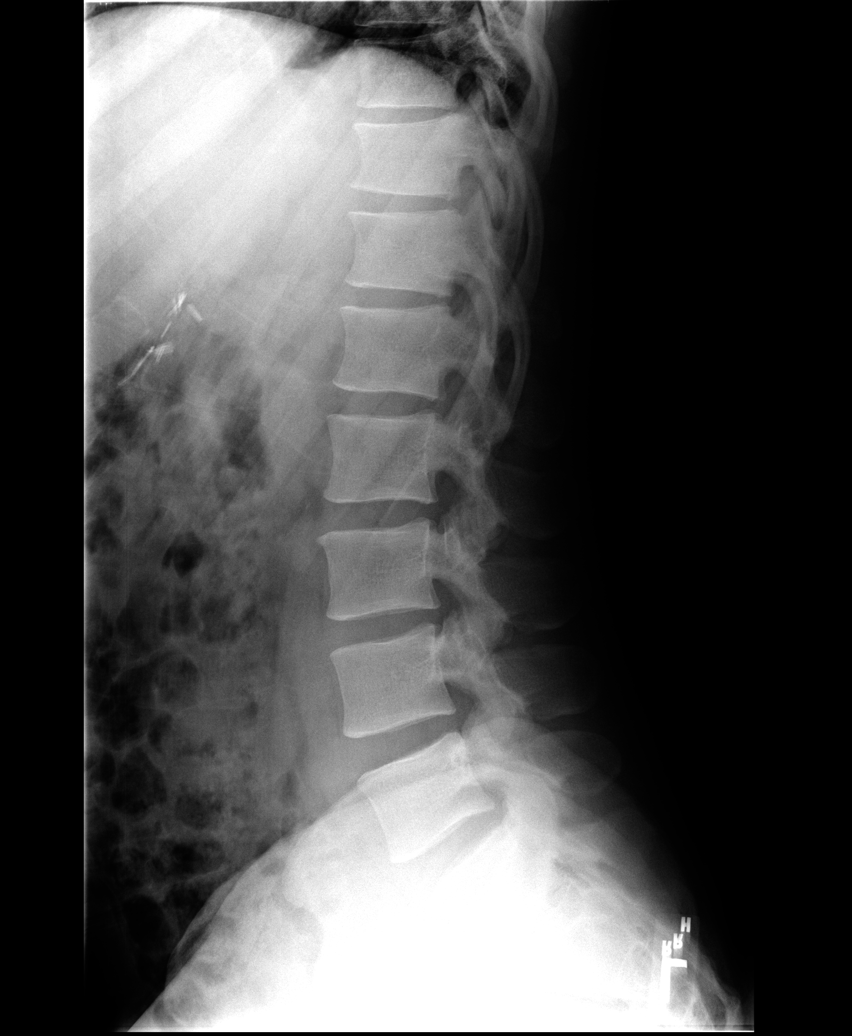

[view not recorded (5 of 5)]
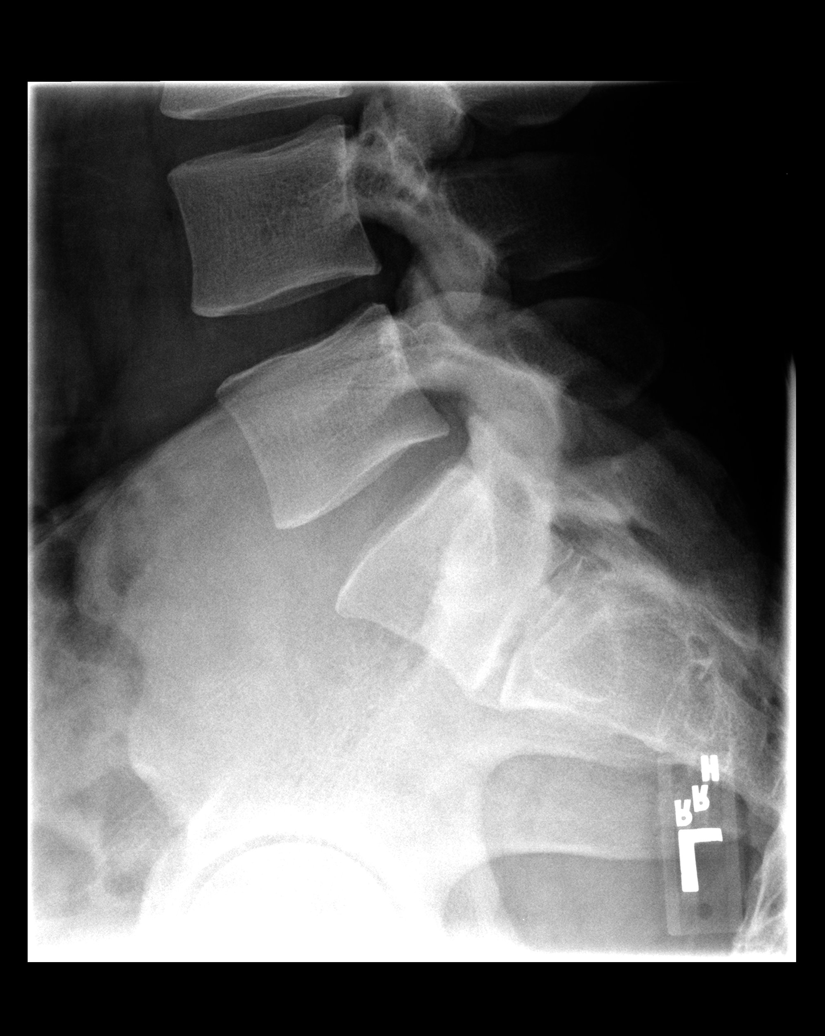

[5 of 5 positions shown; findings below may reference images not displayed]

FINDINGS: Normal vertebral alignment is maintained, without
significant subluxation.

The S1 vertebra is partially segmental.

No fracture or acute lumbar spine findings are identified.
IMPRESSION: 1.  No significant lumbar spine abnormality is identified.

## 2011-03-18 LAB — HEMOGLOBIN AND HEMATOCRIT, BLOOD
HCT: 48.2
Hemoglobin: 16.1

## 2011-03-18 LAB — BASIC METABOLIC PANEL
BUN: 10
Calcium: 9.2
Creatinine, Ser: 1.06
GFR calc Af Amer: >60
GFR calc non Af Amer: >60

## 2013-10-29 ENCOUNTER — Encounter (HOSPITAL_COMMUNITY): Payer: Self-pay | Admitting: Emergency Medicine

## 2013-10-29 ENCOUNTER — Emergency Department (HOSPITAL_COMMUNITY): Payer: Self-pay

## 2013-10-29 ENCOUNTER — Emergency Department (HOSPITAL_COMMUNITY)
Admission: EM | Admit: 2013-10-29 | Discharge: 2013-10-29 | Disposition: A | Discharge status: Home / Self Care | Payer: Self-pay | Attending: Emergency Medicine | Admitting: Emergency Medicine

## 2013-10-29 DIAGNOSIS — I10 Essential (primary) hypertension: Secondary | ICD-10-CM | POA: Insufficient documentation

## 2013-10-29 DIAGNOSIS — IMO0002 Reserved for concepts with insufficient information to code with codable children: Secondary | ICD-10-CM | POA: Insufficient documentation

## 2013-10-29 DIAGNOSIS — S39011A Strain of muscle, fascia and tendon of abdomen, initial encounter: Secondary | ICD-10-CM

## 2013-10-29 DIAGNOSIS — F17200 Nicotine dependence, unspecified, uncomplicated: Secondary | ICD-10-CM | POA: Insufficient documentation

## 2013-10-29 DIAGNOSIS — J3489 Other specified disorders of nose and nasal sinuses: Secondary | ICD-10-CM | POA: Insufficient documentation

## 2013-10-29 DIAGNOSIS — R0602 Shortness of breath: Secondary | ICD-10-CM | POA: Insufficient documentation

## 2013-10-29 DIAGNOSIS — R0609 Other forms of dyspnea: Secondary | ICD-10-CM | POA: Insufficient documentation

## 2013-10-29 DIAGNOSIS — Y939 Activity, unspecified: Secondary | ICD-10-CM | POA: Insufficient documentation

## 2013-10-29 DIAGNOSIS — Y929 Unspecified place or not applicable: Secondary | ICD-10-CM | POA: Insufficient documentation

## 2013-10-29 DIAGNOSIS — Z9089 Acquired absence of other organs: Secondary | ICD-10-CM | POA: Insufficient documentation

## 2013-10-29 DIAGNOSIS — X58XXXA Exposure to other specified factors, initial encounter: Secondary | ICD-10-CM | POA: Insufficient documentation

## 2013-10-29 DIAGNOSIS — R0989 Other specified symptoms and signs involving the circulatory and respiratory systems: Secondary | ICD-10-CM | POA: Insufficient documentation

## 2013-10-29 HISTORY — DX: Essential (primary) hypertension: I10

## 2013-10-29 LAB — URINALYSIS, ROUTINE W REFLEX MICROSCOPIC
Bilirubin Urine: NEGATIVE
GLUCOSE, UA: NEGATIVE mg/dL
HGB URINE DIPSTICK: NEGATIVE
KETONES UR: NEGATIVE mg/dL
LEUKOCYTES UA: NEGATIVE
Nitrite: NEGATIVE
PH: 6 (ref 5.0–8.0)
PROTEIN: NEGATIVE mg/dL
Specific Gravity, Urine: 1.02 (ref 1.005–1.030)
Urobilinogen, UA: 0.2 mg/dL (ref 0.0–1.0)

## 2013-10-29 LAB — BASIC METABOLIC PANEL
BUN: 17 mg/dL (ref 6–23)
CHLORIDE: 101 meq/L (ref 96–112)
CO2: 25 meq/L (ref 19–32)
Calcium: 9.2 mg/dL (ref 8.4–10.5)
Creatinine, Ser: 0.85 mg/dL (ref 0.50–1.35)
GFR calc Af Amer: >90 mL/min (ref >90)
GFR calc non Af Amer: >90 mL/min (ref >90)
GLUCOSE: 102 mg/dL — AB (ref 70–99)
POTASSIUM: 4.2 meq/L (ref 3.7–5.3)
SODIUM: 138 meq/L (ref 137–147)

## 2013-10-29 LAB — HEPATIC FUNCTION PANEL
ALK PHOS: 91 U/L (ref 39–117)
ALT: 62 U/L — AB (ref 0–53)
AST: 45 U/L — AB (ref 0–37)
Albumin: 3.9 g/dL (ref 3.5–5.2)
Bilirubin, Direct: <0.2 mg/dL (ref 0.0–0.3)
TOTAL PROTEIN: 7.6 g/dL (ref 6.0–8.3)
Total Bilirubin: 0.2 mg/dL — ABNORMAL LOW (ref 0.3–1.2)

## 2013-10-29 LAB — CBC WITH DIFFERENTIAL/PLATELET
Basophils Absolute: 0 10*3/uL (ref 0.0–0.1)
Basophils Relative: 0 % (ref 0–1)
EOS ABS: 0.3 10*3/uL (ref 0.0–0.7)
EOS PCT: 5 % (ref 0–5)
HCT: 42.9 % (ref 39.0–52.0)
HEMOGLOBIN: 14.8 g/dL (ref 13.0–17.0)
LYMPHS ABS: 2.2 10*3/uL (ref 0.7–4.0)
LYMPHS PCT: 36 % (ref 12–46)
MCH: 29.7 pg (ref 26.0–34.0)
MCHC: 34.5 g/dL (ref 30.0–36.0)
MCV: 86.1 fL (ref 78.0–100.0)
MONOS PCT: 12 % (ref 3–12)
Monocytes Absolute: 0.7 10*3/uL (ref 0.1–1.0)
Neutro Abs: 2.9 10*3/uL (ref 1.7–7.7)
Neutrophils Relative %: 47 % (ref 43–77)
Platelets: 186 10*3/uL (ref 150–400)
RBC: 4.98 MIL/uL (ref 4.22–5.81)
RDW: 14.3 % (ref 11.5–15.5)
WBC: 6.1 10*3/uL (ref 4.0–10.5)

## 2013-10-29 MED ORDER — ONDANSETRON HCL 4 MG/2ML IJ SOLN
4.0000 mg | Freq: Once | INTRAMUSCULAR | Status: AC
  Administered 2013-10-29: 4 mg via INTRAVENOUS
  Filled 2013-10-29: qty 2

## 2013-10-29 MED ORDER — NAPROXEN 500 MG PO TABS: 500.0000 mg | ORAL_TABLET | Freq: Two times a day (BID) | ORAL | Status: DC

## 2013-10-29 MED ORDER — MORPHINE SULFATE 4 MG/ML IJ SOLN: 4.0000 mg | INTRAMUSCULAR | Status: DC | PRN

## 2013-10-29 MED ORDER — HYDROCODONE-ACETAMINOPHEN 5-325 MG PO TABS: 2.0000 | ORAL_TABLET | ORAL | Status: DC | PRN

## 2013-10-29 MED ORDER — METHOCARBAMOL 500 MG PO TABS: 500.0000 mg | ORAL_TABLET | Freq: Two times a day (BID) | ORAL | Status: DC

## 2013-10-29 MED ORDER — METHOCARBAMOL 1000 MG/10ML IJ SOLN
1000.0000 mg | Freq: Once | INTRAVENOUS | Status: AC
  Administered 2013-10-29: 1000 mg via INTRAVENOUS
  Filled 2013-10-29: qty 10

## 2013-10-29 NOTE — ED Notes (Signed)
Pt provided with instructions, verbalized understanding, declined work note for tonight as scheduled off. Girlfriend in room to drive pt home

## 2013-10-29 NOTE — Discharge Instructions (Signed)
Your pain is from a strain of the muscles in your abdominal wall.  Avoid heavy lifting, bending, twisting.  Mrs. as directed. Do not take Vicodin before work or driving.

## 2013-10-29 NOTE — ED Notes (Signed)
Family at bedside. 

## 2013-10-29 NOTE — ED Notes (Signed)
Call placed to pharmacy to verify that they have received the IV order-

## 2013-10-29 NOTE — ED Provider Notes (Signed)
CSN: [id number]     Arrival date & time 10/29/13  1026 History  This chart was scribed for Rolland PorterMark Odelle Kosier, MD by Jarvis Morganaylor Ferguson, ED Scribe. This patient was seen in room APA07/[facility]07 and the patient's care was started at 11:00 AM.    Chief Complaint  Patient presents with  . Abdominal Pain     The history is provided by the patient. No language interpreter was used.   HPI Comments: Cory Watson is a 38 y.o. Male with a h/o of HTN who presents to the Emergency Department complaining of sharp, throbbing, "9/10", RUQ abdominal pain that radiates to his back, onset this morning. Patient states at work the pain began and it feels like "spasms" in her right abdomen. He states that he is having associated mild dyspnea, and sinus congestion. Patient states that when he stretches and lifts his arm above his head the pain in relieved. Patient states that when he relaxes the pain comes back. Patient denies any history of kidney stones. He has previously had abdominal surgery when he had a cholecystectomy. Patient denies any injury to the area, or abnormal bending, twisting or movement. Patient denies any nausea, emesis, or diarrhea.   Past Medical History  Diagnosis Date  . Hypertension    Past Surgical History  Procedure Laterality Date  . Cholecystectomy     No family history on file. History  Substance Use Topics  . Smoking status: Current Every Day Smoker    Types: Cigarettes  . Smokeless tobacco: Not on file  . Alcohol Use: No    Review of Systems  Constitutional: Negative for fever, chills, diaphoresis, appetite change and fatigue.  HENT: Positive for congestion. Negative for mouth sores, sore throat and trouble swallowing.   Eyes: Negative for visual disturbance.  Respiratory: Positive for shortness of breath. Negative for cough, chest tightness and wheezing.   Cardiovascular: Negative for chest pain.  Gastrointestinal: Positive for abdominal pain. Negative for nausea, vomiting,  diarrhea and abdominal distention.  Endocrine: Negative for polydipsia, polyphagia and polyuria.  Genitourinary: Negative for dysuria, frequency and hematuria.  Musculoskeletal: Negative for gait problem.  Skin: Negative for color change, pallor and rash.  Neurological: Negative for dizziness, syncope, light-headedness and headaches.  Hematological: Does not bruise/bleed easily.  Psychiatric/Behavioral: Negative for behavioral problems and confusion.      Allergies  Review of patient's allergies indicates no known allergies.  Home Medications   Prior to Admission medications   Not on File   Triage Vitals: BP 139/92  Pulse 82  Temp(Src) 97.8 F (36.6 C) (Oral)  Resp 20  Ht 5\' 10"  (1.778 m)  Wt 200 lb (90.719 kg)  BMI 28.70 kg/m2  SpO2 99%  Physical Exam  Constitutional: He is oriented to person, place, and time. He appears well-developed and well-nourished. No distress.  HENT:  Head: Normocephalic.  Eyes: Conjunctivae are normal. Pupils are equal, round, and reactive to light. No scleral icterus.  Neck: Normal range of motion. Neck supple. No thyromegaly present.  Cardiovascular: Normal rate and regular rhythm.  Exam reveals no gallop and no friction rub.   No murmur heard. Pulmonary/Chest: Effort normal and breath sounds normal. No respiratory distress. He has no wheezes. He has no rales.  No diminished right basilar breath sounds  Abdominal: Soft. Bowel sounds are normal. He exhibits no distension. There is no tenderness. There is no rebound.  cholecystectomy scar. Tenderness in RUQ. Tenderness in right flank. Pain with twisting and moving  Musculoskeletal: Normal range of  motion.  Neurological: He is alert and oriented to person, place, and time.  Skin: Skin is warm and dry. No rash noted.  Psychiatric: He has a normal mood and affect. His behavior is normal.    ED Course  Procedures (including critical care time)  DIAGNOSTIC STUDIES: Oxygen Saturation is 99% on  RA, normal by my interpretation.    COORDINATION OF CARE: 11:10 AM- Will order BMP, CBC with diff, and UA. Pt advised of plan for treatment and pt agrees.    Labs Review Labs Reviewed  BASIC METABOLIC PANEL - Abnormal; Notable for the following:    Glucose, Bld 102 (*)    All other components within normal limits  HEPATIC FUNCTION PANEL - Abnormal; Notable for the following:    AST 45 (*)    ALT 62 (*)    Total Bilirubin 0.2 (*)    All other components within normal limits  CBC WITH DIFFERENTIAL  URINALYSIS, ROUTINE W REFLEX MICROSCOPIC    Imaging Review Dg Chest 2 View  10/29/2013   CLINICAL DATA:  Cough and congestion, shortness of breath  EXAM: CHEST  2 VIEW  COMPARISON:  07/14/2008  FINDINGS: The heart size and mediastinal contours are within normal limits. Both lungs are clear. The visualized skeletal structures are unremarkable.  IMPRESSION: No active cardiopulmonary disease.   Electronically Signed   By: Ruel Favorsrevor  Shick M.D.   On: 10/29/2013 12:49     EKG Interpretation None      MDM   Final diagnoses:  Abdominal wall strain    Studies are negative. His exam was most consistent with abdominal wall muscular strain. This can avoid heavy lifting, bending, twisting. Prescription for an anti-inflammatory, pain meds, muscle relaxants. Precautions regarding the Vicodin prescription given.   I personally performed the services described in this documentation, which was scribed in my presence. The recorded information has been reviewed and is accurate.     Rolland PorterMark Theoren Palka, MD 10/29/13 [phone]

## 2013-10-29 NOTE — ED Notes (Signed)
RUQ pain starting this morning.  Denies n/v/d.

## 2014-02-01 ENCOUNTER — Encounter (HOSPITAL_COMMUNITY): Payer: Self-pay | Admitting: Emergency Medicine

## 2014-02-01 ENCOUNTER — Emergency Department (HOSPITAL_COMMUNITY): Payer: Self-pay

## 2014-02-01 ENCOUNTER — Emergency Department (HOSPITAL_COMMUNITY)
Admission: EM | Admit: 2014-02-01 | Discharge: 2014-02-01 | Disposition: A | Discharge status: Home / Self Care | Payer: Self-pay | Attending: Emergency Medicine | Admitting: Emergency Medicine

## 2014-02-01 DIAGNOSIS — I10 Essential (primary) hypertension: Secondary | ICD-10-CM | POA: Insufficient documentation

## 2014-02-01 DIAGNOSIS — M25529 Pain in unspecified elbow: Secondary | ICD-10-CM | POA: Insufficient documentation

## 2014-02-01 DIAGNOSIS — Z791 Long term (current) use of non-steroidal anti-inflammatories (NSAID): Secondary | ICD-10-CM | POA: Insufficient documentation

## 2014-02-01 DIAGNOSIS — F17200 Nicotine dependence, unspecified, uncomplicated: Secondary | ICD-10-CM | POA: Insufficient documentation

## 2014-02-01 DIAGNOSIS — M25569 Pain in unspecified knee: Secondary | ICD-10-CM | POA: Insufficient documentation

## 2014-02-01 DIAGNOSIS — M25561 Pain in right knee: Secondary | ICD-10-CM

## 2014-02-01 DIAGNOSIS — M7020 Olecranon bursitis, unspecified elbow: Secondary | ICD-10-CM | POA: Insufficient documentation

## 2014-02-01 DIAGNOSIS — R52 Pain, unspecified: Secondary | ICD-10-CM | POA: Insufficient documentation

## 2014-02-01 DIAGNOSIS — M7021 Olecranon bursitis, right elbow: Secondary | ICD-10-CM

## 2014-02-01 MED ORDER — HYDROCODONE-ACETAMINOPHEN 5-325 MG PO TABS
1.0000 | ORAL_TABLET | Freq: Once | ORAL | Status: AC
Start: 2014-02-01 — End: 2014-02-01
  Administered 2014-02-01: 1 via ORAL
  Filled 2014-02-01: qty 1

## 2014-02-01 MED ORDER — KETOROLAC TROMETHAMINE 60 MG/2ML IM SOLN
60.0000 mg | Freq: Once | INTRAMUSCULAR | Status: AC
  Administered 2014-02-01: 60 mg via INTRAMUSCULAR
  Filled 2014-02-01: qty 2

## 2014-02-01 MED ORDER — INDOMETHACIN 25 MG PO CAPS: 25.0000 mg | ORAL_CAPSULE | Freq: Three times a day (TID) | ORAL | Status: DC | PRN

## 2014-02-01 MED ORDER — HYDROCODONE-ACETAMINOPHEN 5-325 MG PO TABS: 1.0000 | ORAL_TABLET | Freq: Four times a day (QID) | ORAL | Status: DC | PRN

## 2014-02-01 NOTE — ED Notes (Signed)
Pt c/o right elbow swelling and right knee swelling and pain. States was tested few months back for gout, was negative but was put on indomethicin. Pt has obvious swelling to right elbow.

## 2014-02-01 NOTE — Discharge Instructions (Signed)
Bursitis Bursitis is a swelling and soreness (inflammation) of a fluid-filled sac (bursa) that overlies and protects a joint. It can be caused by injury, overuse of the joint, arthritis or infection. The joints most likely to be affected are the elbows, shoulders, hips and knees. HOME CARE INSTRUCTIONS   Apply ice to the affected area for 15-20 minutes each hour while awake for 2 days. Put the ice in a plastic bag and place a towel between the bag of ice and your skin.  Rest the injured joint as much as possible, but continue to put the joint through a full range of motion, 4 times per day. (The shoulder joint especially becomes rapidly "frozen" if not used.) When the pain lessens, begin normal slow movements and usual activities.  Only take over-the-counter or prescription medicines for pain, discomfort or fever as directed by your caregiver.  Your caregiver may recommend draining the bursa and injecting medicine into the bursa. This may help the healing process.  Follow all instructions for follow-up with your caregiver. This includes any orthopedic referrals, physical therapy and rehabilitation. Any delay in obtaining necessary care could result in a delay or failure of the bursitis to heal and chronic pain. SEEK IMMEDIATE MEDICAL CARE IF:   Your pain increases even during treatment.  You develop an oral temperature above 102 F (38.9 C) and have heat and inflammation over the involved bursa. MAKE SURE YOU:   Understand these instructions.  Will watch your condition.  Will get help right away if you are not doing well or get worse. Document Released: 05/31/2000 Document Revised: 08/26/2011 Document Reviewed: 08/23/2013 [facility] Patient Information 2015 [place], [place]. This information is not intended to replace advice given to you by your health care provider. Make sure you discuss any questions you have with your health care provider. Gout Gout is an inflammatory arthritis caused by  a buildup of uric acid crystals in the joints. Uric acid is a chemical that is normally present in the blood. When the level of uric acid in the blood is too high it can form crystals that deposit in your joints and tissues. This causes joint redness, soreness, and swelling (inflammation). Repeat attacks are common. Over time, uric acid crystals can form into masses (tophi) near a joint, destroying bone and causing disfigurement. Gout is treatable and often preventable. CAUSES  The disease begins with elevated levels of uric acid in the blood. Uric acid is produced by your body when it breaks down a naturally found substance called purines. Certain foods you eat, such as meats and fish, contain high amounts of purines. Causes of an elevated uric acid level include:  Being passed down from parent to child (heredity).  Diseases that cause increased uric acid production (such as obesity, psoriasis, and certain cancers).  Excessive alcohol use.  Diet, especially diets rich in meat and seafood.  Medicines, including certain cancer-fighting medicines (chemotherapy), water pills (diuretics), and aspirin.  Chronic kidney disease. The kidneys are no longer able to remove uric acid well.  Problems with metabolism. Conditions strongly associated with gout include:  Obesity.  High blood pressure.  High cholesterol.  Diabetes. Not everyone with elevated uric acid levels gets gout. It is not understood why some people get gout and others do not. Surgery, joint injury, and eating too much of certain foods are some of the factors that can lead to gout attacks. SYMPTOMS   An attack of gout comes on quickly. It causes intense pain with redness, swelling,  and warmth in a joint.  Fever can occur.  Often, only one joint is involved. Certain joints are more commonly involved:  Base of the big toe.  Knee.  Ankle.  Wrist.  Finger. Without treatment, an attack usually goes away in a few days to  weeks. Between attacks, you usually will not have symptoms, which is different from many other forms of arthritis. DIAGNOSIS  Your caregiver will suspect gout based on your symptoms and exam. In some cases, tests may be recommended. The tests may include:  Blood tests.  Urine tests.  X-rays.  Joint fluid exam. This exam requires a needle to remove fluid from the joint (arthrocentesis). Using a microscope, gout is confirmed when uric acid crystals are seen in the joint fluid. TREATMENT  There are two phases to gout treatment: treating the sudden onset (acute) attack and preventing attacks (prophylaxis).  Treatment of an Acute Attack.  Medicines are used. These include anti-inflammatory medicines or steroid medicines.  An injection of steroid medicine into the affected joint is sometimes necessary.  The painful joint is rested. Movement can worsen the arthritis.  You may use warm or cold treatments on painful joints, depending which works best for you.  Treatment to Prevent Attacks.  If you suffer from frequent gout attacks, your caregiver may advise preventive medicine. These medicines are started after the acute attack subsides. These medicines either help your kidneys eliminate uric acid from your body or decrease your uric acid production. You may need to stay on these medicines for a very long time.  The early phase of treatment with preventive medicine can be associated with an increase in acute gout attacks. For this reason, during the first few months of treatment, your caregiver may also advise you to take medicines usually used for acute gout treatment. Be sure you understand your caregiver's directions. Your caregiver may make several adjustments to your medicine dose before these medicines are effective.  Discuss dietary treatment with your caregiver or dietitian. Alcohol and drinks high in sugar and fructose and foods such as meat, poultry, and seafood can increase uric acid  levels. Your caregiver or dietitian can advise you on drinks and foods that should be limited. HOME CARE INSTRUCTIONS   Do not take aspirin to relieve pain. This raises uric acid levels.  Only take over-the-counter or prescription medicines for pain, discomfort, or fever as directed by your caregiver.  Rest the joint as much as possible. When in bed, keep sheets and blankets off painful areas.  Keep the affected joint raised (elevated).  Apply warm or cold treatments to painful joints. Use of warm or cold treatments depends on which works best for you.  Use crutches if the painful joint is in your leg.  Drink enough fluids to keep your urine clear or pale yellow. This helps your body get rid of uric acid. Limit alcohol, sugary drinks, and fructose drinks.  Follow your dietary instructions. Pay careful attention to the amount of protein you eat. Your daily diet should emphasize fruits, vegetables, whole grains, and fat-free or low-fat milk products. Discuss the use of coffee, vitamin C, and cherries with your caregiver or dietitian. These may be helpful in lowering uric acid levels.  Maintain a healthy body weight. SEEK MEDICAL CARE IF:   You develop diarrhea, vomiting, or any side effects from medicines.  You do not feel better in 24 hours, or you are getting worse. SEEK IMMEDIATE MEDICAL CARE IF:   Your joint becomes suddenly more  tender, and you have chills or a fever. MAKE SURE YOU:   Understand these instructions.  Will watch your condition.  Will get help right away if you are not doing well or get worse. Document Released: 05/31/2000 Document Revised: 10/18/2013 Document Reviewed: 01/15/2012 [facility] Patient Information 2015 [place]. This information is not intended to replace advice given to you by your health care provider. Make sure you discuss any questions you have with your health care provider.

## 2014-02-01 NOTE — ED Provider Notes (Signed)
CSN: [id number]     Arrival date & time 02/01/14  0808 History   First MD Initiated Contact with Patient 02/01/14 [phone]     Chief Complaint  Patient presents with  . Elbow Pain     (Consider location/radiation/quality/duration/timing/severity/associated sxs/prior Treatment) HPI  This is a 38 year old male with a history of hypertension who presents with right elbow and right knee pain. Patient reports onset of symptoms yesterday. States that he was recently incarcerated and was tested for gout. He is unsure whether he tested positive or not. However, he was placed on indomethacin for similar symptoms and improved. Patient states he noted right elbow is swelling yesterday and right knee pain. Denies any known injuries. Reports good range of motion. Elbow pain is 3/10 and the pain is 8/10. The pain is worse with ambulation. He has not taken anything at home for his pain.  Denies any systemic symptoms including fevers.  Past Medical History  Diagnosis Date  . Hypertension    Past Surgical History  Procedure Laterality Date  . Cholecystectomy     History reviewed. No pertinent family history. History  Substance Use Topics  . Smoking status: Current Every Day Smoker    Types: Cigarettes  . Smokeless tobacco: Not on file  . Alcohol Use: No    Review of Systems  Constitutional: Negative.  Negative for fever.  Respiratory: Negative.  Negative for chest tightness and shortness of breath.   Cardiovascular: Negative.  Negative for chest pain.  Gastrointestinal: Negative.  Negative for abdominal pain.  Genitourinary: Negative.  Negative for dysuria.  Musculoskeletal: Positive for joint swelling.       Right elbow, right knee pain  Skin: Negative for wound.  All other systems reviewed and are negative.     Allergies  Review of patient's allergies indicates no known allergies.  Home Medications   Prior to Admission medications   Medication Sig Start Date End Date Taking?  Authorizing Provider  HYDROcodone-acetaminophen (NORCO/VICODIN) 5-325 MG per tablet Take 1-2 tablets by mouth every 6 (six) hours as needed for moderate pain or severe pain. 02/01/14   Shon Baton, MD  indomethacin (INDOCIN) 25 MG capsule Take 1 capsule (25 mg total) by mouth 3 (three) times daily as needed for moderate pain. 02/01/14   Shon Baton, MD   BP 126/85  Pulse 72  Temp(Src) 97.5 F (36.4 C) (Oral)  Resp 17  SpO2 99% Physical Exam  Nursing note and vitals reviewed. Constitutional: He is oriented to person, place, and time. He appears well-developed and well-nourished. No distress.  HENT:  Head: Normocephalic and atraumatic.  Cardiovascular: Normal rate and regular rhythm.   Pulmonary/Chest: Effort normal. No respiratory distress.  Musculoskeletal:  Focused examination of the right elbow reveals diffuse swelling over the olecranon bursa, no overlying skin changes, no warmth, full range of motion both actively and passively without significant pain At this examination of the right knee without significant effusion, mild crepitus noted on range of motion, no joint line pain, no overlying skin changes  Neurological: He is alert and oriented to person, place, and time.  Skin: Skin is warm and dry.  Psychiatric: He has a normal mood and affect.    ED Course  Procedures (including critical care time) Labs Review Labs Reviewed - No data to display  Imaging Review Dg Knee Complete 4 Views Right  02/01/2014   CLINICAL DATA:  Right knee pain  EXAM: RIGHT KNEE - COMPLETE 4+ VIEW  COMPARISON:  None.  FINDINGS: There is no evidence of fracture, dislocation, or joint effusion. There is no evidence of arthropathy or other focal bone abnormality. Soft tissues are unremarkable.  IMPRESSION: No acute osseous finding   Electronically Signed   By: Ruel Favorsrevor  Shick M.D.   On: 02/01/2014 08:59     EKG Interpretation None      MDM   Final diagnoses:  Olecranon bursitis, right   Knee pain, acute, right   Patient presents with right elbow a knee pain. Onset of symptoms yesterday. Reports possible history of gout which responded to indomethacin.  On exam, no evidence of infection or septic joint. Patient has full range of motion. Right elbow appears most consistent with olecranon bursitis. Patient has crepitus of the right knee without effusion and no overlying skin changes. Will obtain a right knee film. Patient was given Toradol and Norco with some improvement of his pain. He is not systemically ill. Knee films are reassuring. Discuss with patient that this could be a gout flare given 2 joint involvement. Given that he is not systemically ill, have low suspicion for infection. We're place again on indomethacin and give Norco for pain management. He does not have a primary physician and will referred to [facility]. Patient was given strict return precautions including increasing pain, decreased range of motion, redness or increasing swelling to the joints.  After history, exam, and medical workup I feel the patient has been appropriately medically screened and is safe for discharge home. Pertinent diagnoses were discussed with the patient. Patient was given return precautions.   Shon Batonourtney F Horton, MD 02/01/14 [phone]

## 2014-07-15 ENCOUNTER — Encounter (HOSPITAL_COMMUNITY): Payer: Self-pay

## 2014-07-15 ENCOUNTER — Emergency Department (HOSPITAL_COMMUNITY)
Admission: EM | Admit: 2014-07-15 | Discharge: 2014-07-15 | Disposition: A | Discharge status: Home / Self Care | Payer: Medicaid - Out of State | Attending: Emergency Medicine | Admitting: Emergency Medicine

## 2014-07-15 DIAGNOSIS — M545 Low back pain, unspecified: Secondary | ICD-10-CM

## 2014-07-15 DIAGNOSIS — M436 Torticollis: Secondary | ICD-10-CM | POA: Diagnosis not present

## 2014-07-15 DIAGNOSIS — Z79899 Other long term (current) drug therapy: Secondary | ICD-10-CM | POA: Insufficient documentation

## 2014-07-15 DIAGNOSIS — Z720 Tobacco use: Secondary | ICD-10-CM | POA: Diagnosis not present

## 2014-07-15 DIAGNOSIS — M1990 Unspecified osteoarthritis, unspecified site: Secondary | ICD-10-CM | POA: Insufficient documentation

## 2014-07-15 DIAGNOSIS — M546 Pain in thoracic spine: Secondary | ICD-10-CM | POA: Insufficient documentation

## 2014-07-15 DIAGNOSIS — I10 Essential (primary) hypertension: Secondary | ICD-10-CM | POA: Diagnosis not present

## 2014-07-15 DIAGNOSIS — Z791 Long term (current) use of non-steroidal anti-inflammatories (NSAID): Secondary | ICD-10-CM | POA: Insufficient documentation

## 2014-07-15 HISTORY — DX: Unspecified osteoarthritis, unspecified site: M19.90

## 2014-07-15 MED ORDER — HYDROCODONE-ACETAMINOPHEN 5-325 MG PO TABS: 1.0000 | ORAL_TABLET | ORAL | Status: AC | PRN

## 2014-07-15 MED ORDER — OXYCODONE-ACETAMINOPHEN 5-325 MG PO TABS
1.0000 | ORAL_TABLET | Freq: Once | ORAL | Status: AC
Start: 2014-07-15 — End: 2014-07-15
  Administered 2014-07-15: 1 via ORAL
  Filled 2014-07-15: qty 1

## 2014-07-15 MED ORDER — IBUPROFEN 400 MG PO TABS
600.0000 mg | ORAL_TABLET | Freq: Once | ORAL | Status: AC
  Administered 2014-07-15: 600 mg via ORAL
  Filled 2014-07-15: qty 2

## 2014-07-15 MED ORDER — CYCLOBENZAPRINE HCL 10 MG PO TABS: 10.0000 mg | ORAL_TABLET | Freq: Two times a day (BID) | ORAL | Status: DC | PRN

## 2014-07-15 MED ORDER — NAPROXEN 500 MG PO TABS: 500.0000 mg | ORAL_TABLET | Freq: Two times a day (BID) | ORAL | Status: AC

## 2014-07-15 MED ORDER — CYCLOBENZAPRINE HCL 10 MG PO TABS
10.0000 mg | ORAL_TABLET | Freq: Once | ORAL | Status: AC
  Administered 2014-07-15: 10 mg via ORAL
  Filled 2014-07-15: qty 1

## 2014-07-15 NOTE — ED Notes (Signed)
Pt reports was helping a friend move and c/o pain in lower back x 2 days.  Reports pain radiates up back and into right side of neck.  Denies falling.

## 2014-07-15 NOTE — Care Management Note (Signed)
Pt is from [place] and has a PCP there. Will follow-up with PCP when he returns home next week.

## 2014-07-15 NOTE — Discharge Instructions (Signed)
Do not take the narcotic or the muscle relaxant if driving because they will make you sleepy.

## 2014-07-15 NOTE — ED Provider Notes (Signed)
CSN: [id number]     Arrival date & time 07/15/14  1017 History   First MD Initiated Contact with Patient 07/15/14 1021     Chief Complaint  Patient presents with  . Back Pain     (Consider location/radiation/quality/duration/timing/severity/associated sxs/prior Treatment) Patient is a 39 y.o. male presenting with back pain. The history is provided by the patient.  Back Pain Location:  Lumbar spine Quality:  Aching and shooting Radiates to:  R shoulder Pain severity:  Severe Pain is:  Same all the time Onset quality:  Sudden Duration:  2 days Timing:  Constant Progression:  Worsening Chronicity:  New Context: physical stress   Relieved by:  Nothing Worsened by:  Ambulation, movement, bending and standing Ineffective treatments:  NSAIDs Associated symptoms: no bladder incontinence, no bowel incontinence and no dysuria    Cory Watson is a 39 y.o. male who presents to the ED with low back pain that radiates to the right upper back and shoulder. Patient reports helping a friend move 2 days ago and in the process felt pain in the lower back. He has been taking ibuprofen without relief. The pain has gotten worse. He denies any UTI symptoms.  Past Medical History  Diagnosis Date  . Hypertension   . Arthritis    Past Surgical History  Procedure Laterality Date  . Cholecystectomy     No family history on file. History  Substance Use Topics  . Smoking status: Current Every Day Smoker    Types: Cigarettes  . Smokeless tobacco: Not on file  . Alcohol Use: No    Review of Systems  Gastrointestinal: Negative for bowel incontinence.  Genitourinary: Negative for bladder incontinence and dysuria.  Musculoskeletal: Positive for back pain.  all other systems negative    Allergies  Review of patient's allergies indicates no known allergies.  Home Medications   Prior to Admission medications   Medication Sig Start Date End Date Taking? Authorizing Provider    Aspirin-Acetaminophen (GOODYS BODY PAIN PO) Take 1 packet by mouth daily as needed.   Yes Historical Provider, MD  ibuprofen (ADVIL,MOTRIN) 800 MG tablet Take 800 mg by mouth every 8 (eight) hours as needed for moderate pain.   Yes Historical Provider, MD  cyclobenzaprine (FLEXERIL) 10 MG tablet Take 1 tablet (10 mg total) by mouth 2 (two) times daily as needed for muscle spasms. 07/15/14   Hope Orlene OchM Neese, NP  HYDROcodone-acetaminophen (NORCO/VICODIN) 5-325 MG per tablet Take 1 tablet by mouth every 4 (four) hours as needed. 07/15/14   Hope Orlene OchM Neese, NP  naproxen (NAPROSYN) 500 MG tablet Take 1 tablet (500 mg total) by mouth 2 (two) times daily. 07/15/14   Hope Orlene OchM Neese, NP   BP 130/93 mmHg  Pulse 96  Temp(Src) 98.3 F (36.8 C) (Oral)  Resp 18  Ht 5\' 10"  (1.778 m)  Wt 210 lb (95.255 kg)  BMI 30.13 kg/m2  SpO2 96% Physical Exam  Constitutional: He is oriented to person, place, and time. He appears well-developed and well-nourished. No distress.  Patient appears uncomfortable.   HENT:  Head: Normocephalic and atraumatic.  Eyes: EOM are normal. Pupils are equal, round, and reactive to light.  Neck: Neck supple. Muscular tenderness present. No spinous process tenderness present. No rigidity. Decreased range of motion: due to pain.    Cardiovascular: Normal rate and regular rhythm.   Pulmonary/Chest: Effort normal. No respiratory distress. He has no wheezes. He has no rales.  Abdominal: Soft. Bowel sounds are normal. There is no  tenderness.  Musculoskeletal: He exhibits no edema.       Lumbar back: He exhibits tenderness, pain and spasm. He exhibits normal range of motion, no deformity and normal pulse.       Back:  Tender on palpation right lower lumbar area, right thoracic area and right side of neck with spasm. No spinal tenderness. Straight leg raises without difficulty but patient complains of pain.   Neurological: He is alert and oriented to person, place, and time. He has normal  strength. No cranial nerve deficit or sensory deficit. Coordination and gait normal.  Reflex Scores:      Bicep reflexes are 2+ on the right side and 2+ on the left side.      Brachioradialis reflexes are 2+ on the right side and 2+ on the left side.      Patellar reflexes are 2+ on the right side and 2+ on the left side.      Achilles reflexes are 2+ on the right side and 2+ on the left side. Ambulatory without foot drag. Radial and pedal pulses 2+, adequate circulation, good touch sensation.   Skin: Skin is warm and dry.  Psychiatric: He has a normal mood and affect. His behavior is normal.  Nursing note and vitals reviewed.   ED Course  Procedures (including critical care time) Labs Review  MDM  39 y.o. male with muscle strain s/p moving furniture for a friend. Will treat for pain and muscle spasm. Stable for discharge without neuro deficits or symptoms that require immediate neuro consult. Discussed with the patient and all questioned fully answered. He will return if any problems arise. Ortho referral given.  Final diagnoses:  Torticollis, acute  Lumbosacral pain     Janne Napoleon, NP 07/15/14 1705  Vida Roller, MD 07/16/14 [phone]

## 2014-09-05 ENCOUNTER — Emergency Department (HOSPITAL_COMMUNITY)
Admission: EM | Admit: 2014-09-05 | Discharge: 2014-09-05 | Disposition: A | Discharge status: Home / Self Care | Payer: Medicaid - Out of State | Attending: Emergency Medicine | Admitting: Emergency Medicine

## 2014-09-05 ENCOUNTER — Encounter (HOSPITAL_COMMUNITY): Payer: Self-pay | Admitting: Emergency Medicine

## 2014-09-05 DIAGNOSIS — I10 Essential (primary) hypertension: Secondary | ICD-10-CM | POA: Diagnosis not present

## 2014-09-05 DIAGNOSIS — M545 Low back pain, unspecified: Secondary | ICD-10-CM

## 2014-09-05 DIAGNOSIS — G8929 Other chronic pain: Secondary | ICD-10-CM | POA: Insufficient documentation

## 2014-09-05 DIAGNOSIS — Z7982 Long term (current) use of aspirin: Secondary | ICD-10-CM | POA: Diagnosis not present

## 2014-09-05 DIAGNOSIS — M1990 Unspecified osteoarthritis, unspecified site: Secondary | ICD-10-CM | POA: Insufficient documentation

## 2014-09-05 DIAGNOSIS — Z720 Tobacco use: Secondary | ICD-10-CM | POA: Insufficient documentation

## 2014-09-05 MED ORDER — CYCLOBENZAPRINE HCL 10 MG PO TABS: 10.0000 mg | ORAL_TABLET | Freq: Three times a day (TID) | ORAL | Status: AC

## 2014-09-05 MED ORDER — CELECOXIB 100 MG PO CAPS: ORAL_CAPSULE | ORAL | Status: AC

## 2014-09-05 NOTE — ED Notes (Signed)
Pt reports lower back pain intermittently since January. Pt denies any new or recent injury. Pt reports seen for same in the past.

## 2014-09-05 NOTE — Discharge Instructions (Signed)
Please call Dr Ophelia CharterYates or the orthopedic specialist of your choice for evaluation of your back. Back Pain, Adult Low back pain is very common. About 1 in 5 people have back pain.The cause of low back pain is rarely dangerous. The pain often gets better over time.About half of people with a sudden onset of back pain feel better in just 2 weeks. About 8 in 10 people feel better by 6 weeks.  CAUSES Some common causes of back pain include:  Strain of the muscles or ligaments supporting the spine.  Wear and tear (degeneration) of the spinal discs.  Arthritis.  Direct injury to the back. DIAGNOSIS Most of the time, the direct cause of low back pain is not known.However, back pain can be treated effectively even when the exact cause of the pain is unknown.Answering your caregiver's questions about your overall health and symptoms is one of the most accurate ways to make sure the cause of your pain is not dangerous. If your caregiver needs more information, he or she may order lab work or imaging tests (X-rays or MRIs).However, even if imaging tests show changes in your back, this usually does not require surgery. HOME CARE INSTRUCTIONS For many people, back pain returns.Since low back pain is rarely dangerous, it is often a condition that people can learn to [facility] LLC[facility]on their own.   Remain active. It is stressful on the back to sit or stand in one place. Do not sit, drive, or stand in one place for more than 30 minutes at a time. Take short walks on level surfaces as soon as pain allows.Try to increase the length of time you walk each day.  Do not stay in bed.Resting more than 1 or 2 days can delay your recovery.  Do not avoid exercise or work.Your body is made to move.It is not dangerous to be active, even though your back may hurt.Your back will likely heal faster if you return to being active before your pain is gone.  Pay attention to your body when you bend and lift. Many people have  less discomfortwhen lifting if they bend their knees, keep the load close to their bodies,and avoid twisting. Often, the most comfortable positions are those that put less stress on your recovering back.  Find a comfortable position to sleep. Use a firm mattress and lie on your side with your knees slightly bent. If you lie on your back, put a pillow under your knees.  Only take over-the-counter or prescription medicines as directed by your caregiver. Over-the-counter medicines to reduce pain and inflammation are often the most helpful.Your caregiver may prescribe muscle relaxant drugs.These medicines help dull your pain so you can more quickly return to your normal activities and healthy exercise.  Put ice on the injured area.  Put ice in a plastic bag.  Place a towel between your skin and the bag.  Leave the ice on for 15-20 minutes, 03-04 times a day for the first 2 to 3 days. After that, ice and heat may be alternated to reduce pain and spasms.  Ask your caregiver about trying back exercises and gentle massage. This may be of some benefit.  Avoid feeling anxious or stressed.Stress increases muscle tension and can worsen back pain.It is important to recognize when you are anxious or stressed and learn ways to manage it.Exercise is a great option. SEEK MEDICAL CARE IF:  You have pain that is not relieved with rest or medicine.  You have pain that does not improve  in 1 week.  You have new symptoms.  You are generally not feeling well. SEEK IMMEDIATE MEDICAL CARE IF:   You have pain that radiates from your back into your legs.  You develop new bowel or bladder control problems.  You have unusual weakness or numbness in your arms or legs.  You develop nausea or vomiting.  You develop abdominal pain.  You feel faint. Document Released: 06/03/2005 Document Revised: 12/03/2011 Document Reviewed: 10/05/2013 [facility] Patient Information 2015 [place], [place]. This information  is not intended to replace advice given to you by your health care provider. Make sure you discuss any questions you have with your health care provider.

## 2014-09-05 NOTE — ED Provider Notes (Signed)
CSN: [id number]     Arrival date & time 09/05/14  [id number] History  This chart was scribed for Ivery QualeHobson [name], PA-C with Bethann BerkshireJoseph Zammit, MD by Tonye RoyaltyJoshua Chen, ED Scribe. This patient was seen in room APFT24/APFT24 and the patient's care was started at 11:39 AM.    Chief Complaint  Patient presents with  . Back Pain   Patient is a 39 y.o. male presenting with back pain. The history is provided by the patient. No language interpreter was used.  Back Pain Location:  Lumbar spine Radiates to: left and right legs. Pain severity:  Moderate Pain is:  Same all the time Onset quality:  Gradual Duration:  2 months Timing:  Intermittent Progression:  Worsening Chronicity:  New Context: not recent injury   Relieved by:  Narcotics, NSAIDs and muscle relaxants Worsened by:  Nothing tried Ineffective treatments:  None tried Associated symptoms: no weakness     HPI Comments: Cory Watson is a 39 y.o. male who presents to the Emergency Department complaining of low back pain radiating down legs with onset 2 months ago, worsening recently. He states he was evaluated here in January and was told the problem was muscular in nature; he denies seeing any specialists after that. He denies bowel or baladder incontinence or weakness in his legs.  Past Medical History  Diagnosis Date  . Hypertension   . Arthritis    Past Surgical History  Procedure Laterality Date  . Cholecystectomy     History reviewed. No pertinent family history. History  Substance Use Topics  . Smoking status: Current Every Day Smoker    Types: Cigarettes  . Smokeless tobacco: Not on file  . Alcohol Use: No    Review of Systems  Musculoskeletal: Positive for back pain.  Neurological: Negative for weakness.  All other systems reviewed and are negative.     Allergies  Review of patient's allergies indicates no known allergies.  Home Medications   Prior to Admission medications   Medication Sig Start Date End Date  Taking? Authorizing Provider  Aspirin-Acetaminophen (GOODYS BODY PAIN PO) Take 1 packet by mouth daily as needed.    Historical Provider, MD  cyclobenzaprine (FLEXERIL) 10 MG tablet Take 1 tablet (10 mg total) by mouth 2 (two) times daily as needed for muscle spasms. 07/15/14   Hope Orlene OchM Neese, NP  HYDROcodone-acetaminophen (NORCO/VICODIN) 5-325 MG per tablet Take 1 tablet by mouth every 4 (four) hours as needed. 07/15/14   Hope Orlene OchM Neese, NP  ibuprofen (ADVIL,MOTRIN) 800 MG tablet Take 800 mg by mouth every 8 (eight) hours as needed for moderate pain.    Historical Provider, MD  naproxen (NAPROSYN) 500 MG tablet Take 1 tablet (500 mg total) by mouth 2 (two) times daily. 07/15/14   Hope Orlene OchM Neese, NP   BP 148/90 mmHg  Pulse 91  Temp(Src) 98.5 F (36.9 C) (Oral)  Resp 18  Ht 5' 10.5" (1.791 m)  Wt 216 lb (97.977 kg)  BMI 30.54 kg/m2  SpO2 99% Physical Exam  Constitutional: He is oriented to person, place, and time. He appears well-developed and well-nourished.  HENT:  Head: Normocephalic and atraumatic.  Eyes: Conjunctivae are normal.  Neck: Normal range of motion. Neck supple.  Pulmonary/Chest: Effort normal.  Musculoskeletal: Normal range of motion.  Lower lumbar pain to palpation  left paraspinal area pain no palpable stepoff of the lumbarspine  Neurological: He is alert and oriented to person, place, and time.  No motor or sensory deficits No gross neurologic deficits  Skin: Skin is warm and dry.  Psychiatric: He has a normal mood and affect.  Nursing note and vitals reviewed.   ED Course  Procedures (including critical care time)  DIAGNOSTIC STUDIES: Oxygen Saturation is 99% on room air, normal by my interpretation.    COORDINATION OF CARE: 11:45 AM Discussed treatment plan with patient at beside, including follow up with orthopedist. The patient agrees with the plan and has no further questions at this time.   Labs Review Labs Reviewed - No data to display  Imaging  Review No results found.   EKG Interpretation None      MDM  Exam is consistent with chronic back pain. No gross neuro deficits. Vital signs stable. Rx for celebrex and flexeril given to the patient.   Final diagnoses:  None    **I have reviewed nursing notes, vital signs, and all appropriate lab and imaging results for this patient.*  **I personally performed the services described in this documentation, which was scribed in my presence. The recorded information has been reviewed and is accurate.Ivery Quale, PA-C 09/07/14 2328  Bethann Berkshire, MD 09/08/14 1340

## 2021-03-26 ENCOUNTER — Encounter (HOSPITAL_COMMUNITY): Payer: Self-pay | Admitting: Emergency Medicine

## 2021-03-26 ENCOUNTER — Other Ambulatory Visit: Payer: Self-pay

## 2021-03-26 DIAGNOSIS — R079 Chest pain, unspecified: Secondary | ICD-10-CM | POA: Diagnosis present

## 2021-03-26 DIAGNOSIS — R109 Unspecified abdominal pain: Secondary | ICD-10-CM | POA: Insufficient documentation

## 2021-03-26 DIAGNOSIS — R0789 Other chest pain: Secondary | ICD-10-CM | POA: Insufficient documentation

## 2021-03-26 DIAGNOSIS — I10 Essential (primary) hypertension: Secondary | ICD-10-CM | POA: Insufficient documentation

## 2021-03-26 DIAGNOSIS — G8929 Other chronic pain: Secondary | ICD-10-CM | POA: Diagnosis not present

## 2021-03-26 DIAGNOSIS — F17210 Nicotine dependence, cigarettes, uncomplicated: Secondary | ICD-10-CM | POA: Insufficient documentation

## 2021-03-26 NOTE — ED Triage Notes (Signed)
Pt c/o right flank pain since March; denies urinary symptoms, denies fever/n/v/d

## 2021-03-27 ENCOUNTER — Emergency Department (HOSPITAL_COMMUNITY): Payer: Medicaid Other

## 2021-03-27 ENCOUNTER — Emergency Department (HOSPITAL_COMMUNITY)
Admission: EM | Admit: 2021-03-27 | Discharge: 2021-03-27 | Disposition: A | Discharge status: Home / Self Care | Payer: Medicaid Other | Attending: Emergency Medicine | Admitting: Emergency Medicine

## 2021-03-27 DIAGNOSIS — G8929 Other chronic pain: Secondary | ICD-10-CM

## 2021-03-27 DIAGNOSIS — R0789 Other chest pain: Secondary | ICD-10-CM

## 2021-03-27 HISTORY — DX: Reserved for concepts with insufficient information to code with codable children: IMO0002

## 2021-03-27 HISTORY — DX: Sciatica, unspecified side: M54.30

## 2021-03-27 HISTORY — DX: Fibromyalgia: M79.7

## 2021-03-27 HISTORY — DX: Systemic lupus erythematosus, unspecified: M32.9

## 2021-03-27 MED ORDER — PREDNISONE 50 MG PO TABS
60.0000 mg | ORAL_TABLET | Freq: Once | ORAL | Status: AC
  Administered 2021-03-27: 60 mg via ORAL
  Filled 2021-03-27: qty 1

## 2021-03-27 MED ORDER — PREDNISONE 20 MG PO TABS: 40.0000 mg | ORAL_TABLET | Freq: Every day | ORAL | 0 refills | Status: AC

## 2021-03-27 MED ORDER — METHOCARBAMOL 500 MG PO TABS: 500.0000 mg | ORAL_TABLET | Freq: Three times a day (TID) | ORAL | 0 refills | Status: AC | PRN

## 2021-03-27 MED ORDER — IBUPROFEN 800 MG PO TABS
800.0000 mg | ORAL_TABLET | Freq: Once | ORAL | Status: AC
  Administered 2021-03-27: 800 mg via ORAL
  Filled 2021-03-27: qty 2

## 2021-03-27 NOTE — ED Notes (Signed)
Patient transported to X-ray 
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
## 2021-06-16 ENCOUNTER — Emergency Department (HOSPITAL_COMMUNITY): Payer: Medicaid Other

## 2021-06-16 ENCOUNTER — Other Ambulatory Visit: Payer: Self-pay

## 2021-06-16 ENCOUNTER — Emergency Department (HOSPITAL_COMMUNITY)
Admission: EM | Admit: 2021-06-16 | Discharge: 2021-06-16 | Disposition: A | Discharge status: Home / Self Care | Payer: Medicaid Other | Attending: Emergency Medicine | Admitting: Emergency Medicine

## 2021-06-16 ENCOUNTER — Encounter (HOSPITAL_COMMUNITY): Payer: Self-pay

## 2021-06-16 DIAGNOSIS — F17210 Nicotine dependence, cigarettes, uncomplicated: Secondary | ICD-10-CM | POA: Diagnosis not present

## 2021-06-16 DIAGNOSIS — M542 Cervicalgia: Secondary | ICD-10-CM | POA: Diagnosis not present

## 2021-06-16 DIAGNOSIS — I10 Essential (primary) hypertension: Secondary | ICD-10-CM | POA: Insufficient documentation

## 2021-06-16 DIAGNOSIS — R202 Paresthesia of skin: Secondary | ICD-10-CM | POA: Diagnosis not present

## 2021-06-16 DIAGNOSIS — R079 Chest pain, unspecified: Secondary | ICD-10-CM | POA: Insufficient documentation

## 2021-06-16 DIAGNOSIS — M549 Dorsalgia, unspecified: Secondary | ICD-10-CM | POA: Insufficient documentation

## 2021-06-16 DIAGNOSIS — R519 Headache, unspecified: Secondary | ICD-10-CM | POA: Diagnosis not present

## 2021-06-16 DIAGNOSIS — Z20822 Contact with and (suspected) exposure to covid-19: Secondary | ICD-10-CM | POA: Diagnosis not present

## 2021-06-16 DIAGNOSIS — Z79899 Other long term (current) drug therapy: Secondary | ICD-10-CM | POA: Insufficient documentation

## 2021-06-16 DIAGNOSIS — R109 Unspecified abdominal pain: Secondary | ICD-10-CM | POA: Diagnosis not present

## 2021-06-16 DIAGNOSIS — R531 Weakness: Secondary | ICD-10-CM | POA: Diagnosis not present

## 2021-06-16 DIAGNOSIS — Y92410 Unspecified street and highway as the place of occurrence of the external cause: Secondary | ICD-10-CM | POA: Insufficient documentation

## 2021-06-16 DIAGNOSIS — M79609 Pain in unspecified limb: Secondary | ICD-10-CM

## 2021-06-16 LAB — CBC WITH DIFFERENTIAL/PLATELET
Abs Immature Granulocytes: 0.02 10*3/uL (ref 0.00–0.07)
Basophils Absolute: 0 10*3/uL (ref 0.0–0.1)
Basophils Relative: 0 %
Eosinophils Absolute: 0.2 10*3/uL (ref 0.0–0.5)
Eosinophils Relative: 2 %
HCT: 46.5 % (ref 39.0–52.0)
Hemoglobin: 15.7 g/dL (ref 13.0–17.0)
Immature Granulocytes: 0 %
Lymphocytes Relative: 27 %
Lymphs Abs: 2.9 10*3/uL (ref 0.7–4.0)
MCH: 30.3 pg (ref 26.0–34.0)
MCHC: 33.8 g/dL (ref 30.0–36.0)
MCV: 89.8 fL (ref 80.0–100.0)
Monocytes Absolute: 1.4 10*3/uL — ABNORMAL HIGH (ref 0.1–1.0)
Monocytes Relative: 14 %
Neutro Abs: 5.9 10*3/uL (ref 1.7–7.7)
Neutrophils Relative %: 57 %
Platelets: 242 10*3/uL (ref 150–400)
RBC: 5.18 MIL/uL (ref 4.22–5.81)
RDW: 14.7 % (ref 11.5–15.5)
WBC: 10.4 10*3/uL (ref 4.0–10.5)
nRBC: 0 % (ref 0.0–0.2)

## 2021-06-16 LAB — COMPREHENSIVE METABOLIC PANEL
ALT: 41 U/L (ref 0–44)
AST: 38 U/L (ref 15–41)
Albumin: 4.4 g/dL (ref 3.5–5.0)
Alkaline Phosphatase: 91 U/L (ref 38–126)
Anion gap: 10 (ref 5–15)
BUN: 10 mg/dL (ref 6–20)
CO2: 25 mmol/L (ref 22–32)
Calcium: 9.2 mg/dL (ref 8.9–10.3)
Chloride: 101 mmol/L (ref 98–111)
Creatinine, Ser: 0.84 mg/dL (ref 0.61–1.24)
GFR, Estimated: >60 mL/min (ref >60)
Glucose, Bld: 101 mg/dL — ABNORMAL HIGH (ref 70–99)
Potassium: 4.2 mmol/L (ref 3.5–5.1)
Sodium: 136 mmol/L (ref 135–145)
Total Bilirubin: 0.9 mg/dL (ref 0.3–1.2)
Total Protein: 8.4 g/dL — ABNORMAL HIGH (ref 6.5–8.1)

## 2021-06-16 LAB — URINALYSIS, ROUTINE W REFLEX MICROSCOPIC
Glucose, UA: NEGATIVE mg/dL
Hgb urine dipstick: NEGATIVE
Ketones, ur: NEGATIVE mg/dL
Leukocytes,Ua: NEGATIVE
Nitrite: NEGATIVE
Protein, ur: NEGATIVE mg/dL
Specific Gravity, Urine: 1.01 (ref 1.005–1.030)
pH: 5.5 (ref 5.0–8.0)

## 2021-06-16 LAB — RESP PANEL BY RT-PCR (FLU A&B, COVID) ARPGX2
Influenza A by PCR: NEGATIVE
Influenza B by PCR: NEGATIVE
SARS Coronavirus 2 by RT PCR: NEGATIVE

## 2021-06-16 MED ORDER — HYDROMORPHONE HCL 1 MG/ML IJ SOLN
1.0000 mg | Freq: Once | INTRAMUSCULAR | Status: AC
  Administered 2021-06-16: 1 mg via INTRAVENOUS
  Filled 2021-06-16: qty 1

## 2021-06-16 MED ORDER — HYDROCODONE-ACETAMINOPHEN 5-325 MG PO TABS: 1.0000 | ORAL_TABLET | Freq: Four times a day (QID) | ORAL | 0 refills | Status: AC | PRN

## 2021-06-16 MED ORDER — HYDROCODONE-ACETAMINOPHEN 5-325 MG PO TABS
1.0000 | ORAL_TABLET | Freq: Once | ORAL | Status: AC
  Administered 2021-06-16: 1 via ORAL
  Filled 2021-06-16: qty 1

## 2021-06-16 MED ORDER — IOHEXOL 300 MG/ML  SOLN
100.0000 mL | Freq: Once | INTRAMUSCULAR | Status: AC | PRN
  Administered 2021-06-16: 100 mL via INTRAVENOUS

## 2021-06-16 MED ORDER — LORAZEPAM 2 MG/ML IJ SOLN: 1.0000 mg | Freq: Once | INTRAMUSCULAR | Status: DC | PRN

## 2021-06-16 MED ORDER — NAPROXEN 500 MG PO TABS: 500.0000 mg | ORAL_TABLET | Freq: Two times a day (BID) | ORAL | 0 refills | Status: AC

## 2021-06-16 MED ORDER — LORAZEPAM 2 MG/ML IJ SOLN: 2.0000 mg | Freq: Once | INTRAMUSCULAR | Status: DC | PRN

## 2021-06-16 MED ORDER — METHOCARBAMOL 750 MG PO TABS: 750.0000 mg | ORAL_TABLET | Freq: Four times a day (QID) | ORAL | 0 refills | Status: AC

## 2021-06-16 NOTE — ED Provider Notes (Signed)
Care transferred to me.   5:01 PM  Patient had a difficult time doing the MRI and was sent back.  Ativan as needed was ordered.  He is also requesting some pain meds will be given pain meds, though obviously not with the Ativan concurrently.  He wants to take off the c-collar.  I had a discussion with patient.  While I think severe neurologic injury is probably less likely, I discussed that the c-collar is there to protect him and if he takes it off he is assuming responsibility if he has an unstable injury.  He understands this and understands the potential for severe neurologic damage.  MRI still pending.  MRI negative for acute traumatic or emergent finding. Unclear why he's having paresthesias. Otherwise benign workup. Will d/c home .   Pricilla Loveless, MD 06/16/21 2131
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
## 2021-06-16 NOTE — ED Notes (Signed)
Pt removed C-collar at this time. Dr.Goldston is aware. Spoke to pt and understands the risks of paralysis. Pt still removed c-collar at this time. Pending MRI.

## 2021-06-16 NOTE — ED Notes (Signed)
MRI notified at this time. Pt is ready. C-collar is off.

## 2021-06-16 NOTE — ED Notes (Signed)
Patient verbalizes understanding of discharge instructions. Opportunity for questioning and answers were provided. Armband removed by staff, pt discharged from ED. Wheeled out to lobby  

## 2021-06-16 NOTE — ED Notes (Signed)
Requested pain medication from MD
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
## 2021-06-16 NOTE — ED Notes (Signed)
Pt was assisted to the restroom by ambulating no complaints noted.
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
## 2021-06-16 NOTE — ED Notes (Signed)
Pt was brought to MRI and was brought back as pt unable to tolerate MRI with c-collar. Ativan IVP ordered. Pt wants to talk to MD as he said, he wants collar off.

## 2021-06-16 NOTE — Progress Notes (Signed)
Pt brought down to MRI for exam. Pt verbally safety screened and prepared for exam. Pt began stating the c-collar was making him feel like he couldn't breathe when laying flat on the imaging table. Pt requested that it be removed. Called RN to see if she was able to come down and remove it. RN unable to get in touch with the doc to remove c-collar. Pt refused to do the exam with c-collar still on. RN advised to send pt back and reassess for later. Pt sent back to ED via pt transport.
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
## 2021-06-16 NOTE — Discharge Instructions (Signed)
1.  Take naproxen twice a day as prescribed.  If you have additional pain you may take Robaxin for muscle relaxer and Vicodin for moderate to severe pain. 2.  You should have a recheck with your primary care physician.  Recommend recheck within 3 to 7 days.  If symptoms are worsening or failing to improve, you may need referral to a spine specialist or neurosurgeon.
